# Patient Record
Sex: Male | Born: 1980 | Hispanic: Yes | Marital: Single | Smoking: Current every day smoker
Health system: Southern US, Community
[De-identification: ages and names within clinical notes are randomized; demographics above are authoritative.]

## PROBLEM LIST (undated history)

## (undated) HISTORY — PX: NO PAST SURGERIES: SHX2092

---

## 2004-12-22 ENCOUNTER — Emergency Department (HOSPITAL_COMMUNITY): Admission: EM | Admit: 2004-12-22 | Discharge: 2004-12-22 | Payer: Self-pay | Admitting: Emergency Medicine

## 2013-01-19 ENCOUNTER — Emergency Department (HOSPITAL_COMMUNITY)
Admission: EM | Admit: 2013-01-19 | Discharge: 2013-01-19 | Disposition: A | Discharge status: Home / Self Care | Payer: Self-pay | Attending: Emergency Medicine | Admitting: Emergency Medicine

## 2013-01-19 ENCOUNTER — Encounter (HOSPITAL_COMMUNITY): Payer: Self-pay | Admitting: Emergency Medicine

## 2013-01-19 ENCOUNTER — Emergency Department (HOSPITAL_COMMUNITY): Payer: Self-pay

## 2013-01-19 DIAGNOSIS — S20219A Contusion of unspecified front wall of thorax, initial encounter: Secondary | ICD-10-CM | POA: Insufficient documentation

## 2013-01-19 DIAGNOSIS — S20212A Contusion of left front wall of thorax, initial encounter: Secondary | ICD-10-CM

## 2013-01-19 DIAGNOSIS — H53149 Visual discomfort, unspecified: Secondary | ICD-10-CM | POA: Insufficient documentation

## 2013-01-19 DIAGNOSIS — S52202A Unspecified fracture of shaft of left ulna, initial encounter for closed fracture: Secondary | ICD-10-CM

## 2013-01-19 DIAGNOSIS — S0003XA Contusion of scalp, initial encounter: Secondary | ICD-10-CM | POA: Insufficient documentation

## 2013-01-19 DIAGNOSIS — S52209A Unspecified fracture of shaft of unspecified ulna, initial encounter for closed fracture: Secondary | ICD-10-CM | POA: Insufficient documentation

## 2013-01-19 DIAGNOSIS — S8010XA Contusion of unspecified lower leg, initial encounter: Secondary | ICD-10-CM | POA: Insufficient documentation

## 2013-01-19 DIAGNOSIS — S0990XA Unspecified injury of head, initial encounter: Secondary | ICD-10-CM | POA: Insufficient documentation

## 2013-01-19 MED ORDER — LORAZEPAM 1 MG PO TABS: 1.0000 mg | ORAL_TABLET | Freq: Once | ORAL | Status: DC

## 2013-01-19 MED ORDER — MORPHINE SULFATE 4 MG/ML IJ SOLN
4.0000 mg | Freq: Once | INTRAMUSCULAR | Status: AC
  Administered 2013-01-19: 4 mg via INTRAVENOUS
  Filled 2013-01-19: qty 1

## 2013-01-19 MED ORDER — SODIUM CHLORIDE 0.9 % IV BOLUS (SEPSIS)
1000.0000 mL | Freq: Once | INTRAVENOUS | Status: AC
  Administered 2013-01-19: 1000 mL via INTRAVENOUS

## 2013-01-19 MED ORDER — HYDROCODONE-ACETAMINOPHEN 5-325 MG PO TABS: 2.0000 | ORAL_TABLET | ORAL | Status: DC | PRN

## 2013-01-19 MED ORDER — ONDANSETRON HCL 4 MG/2ML IJ SOLN
4.0000 mg | Freq: Once | INTRAMUSCULAR | Status: AC
  Administered 2013-01-19: 4 mg via INTRAVENOUS
  Filled 2013-01-19: qty 2
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
## 2013-01-19 NOTE — Progress Notes (Signed)
Orthopedic Tech Progress Note Patient Details:  [name] 12-15-1980 [id number]  Ortho Devices Type of Ortho Device: Ace wrap;Arm sling;Long arm splint Ortho Device/Splint Location: lue Ortho Device/Splint Interventions: Application   Tara Rud 01/19/2013, 1:41 PM
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
## 2013-01-19 NOTE — Discharge Instructions (Signed)
Take Vicodin as needed for pain. Maintain the splint until your Orthopedic follow up appointment. Follow up with Dr. Melvyn Novas in the office. Call to make an appointment today.   Tome Vicodin , segn sea necesario para Chief Technology Officer . Mantener la frula hasta que su Ortopdica cita de seguimiento . Haga un seguimiento con el Dr. Melvyn Novas en la oficina. Llame para hacer una cita hoy mismo .

## 2013-01-19 NOTE — ED Provider Notes (Signed)
Medical screening examination/treatment/procedure(s) were performed by non-physician practitioner and as supervising physician I was immediately available for consultation/collaboration.  EKG Interpretation   None         Audree Camel, MD 01/19/13 1658

## 2013-01-19 NOTE — ED Notes (Signed)
Ortho at bedside.

## 2013-01-27 ENCOUNTER — Other Ambulatory Visit (HOSPITAL_COMMUNITY): Payer: Self-pay | Admitting: *Deleted

## 2013-01-27 ENCOUNTER — Encounter (HOSPITAL_COMMUNITY): Payer: Self-pay | Admitting: *Deleted

## 2013-01-27 MED ORDER — CEFAZOLIN SODIUM-DEXTROSE 2-3 GM-% IV SOLR: 2.0000 g | INTRAVENOUS | Status: DC

## 2013-01-27 NOTE — Progress Notes (Signed)
Pre op phone call completed using Telephonic Interpreting- [phone].

## 2013-01-28 ENCOUNTER — Ambulatory Visit (HOSPITAL_COMMUNITY): Payer: No Typology Code available for payment source | Admitting: Anesthesiology

## 2013-01-28 ENCOUNTER — Encounter (HOSPITAL_COMMUNITY): Payer: Self-pay

## 2013-01-28 ENCOUNTER — Encounter (HOSPITAL_COMMUNITY): Payer: No Typology Code available for payment source | Admitting: Anesthesiology

## 2013-01-28 ENCOUNTER — Encounter (HOSPITAL_COMMUNITY)
Admission: RE | Disposition: A | Discharge status: Home / Self Care | Payer: Self-pay | Source: Ambulatory Visit | Attending: Orthopedic Surgery

## 2013-01-28 ENCOUNTER — Ambulatory Visit (HOSPITAL_COMMUNITY)
Admission: RE | Admit: 2013-01-28 | Discharge: 2013-01-28 | Disposition: A | Discharge status: Home / Self Care | Payer: No Typology Code available for payment source | Source: Ambulatory Visit | Attending: Orthopedic Surgery | Admitting: Orthopedic Surgery

## 2013-01-28 DIAGNOSIS — S52209A Unspecified fracture of shaft of unspecified ulna, initial encounter for closed fracture: Secondary | ICD-10-CM | POA: Insufficient documentation

## 2013-01-28 HISTORY — PX: ORIF ULNAR FRACTURE: SHX5417

## 2013-01-28 LAB — POCT I-STAT 4, (NA,K, GLUC, HGB,HCT)
Glucose, Bld: 96 mg/dL (ref 70–99)
Potassium: 3.8 mEq/L (ref 3.5–5.1)

## 2013-01-28 SURGERY — OPEN REDUCTION INTERNAL FIXATION (ORIF) ULNAR FRACTURE
Anesthesia: General | Site: Elbow | Laterality: Left | Wound class: Dirty or Infected

## 2013-01-28 MED ORDER — DEXAMETHASONE SODIUM PHOSPHATE 4 MG/ML IJ SOLN
INTRAMUSCULAR | Status: DC | PRN
  Administered 2013-01-28: 4 mg via INTRAVENOUS

## 2013-01-28 MED ORDER — CEFAZOLIN SODIUM-DEXTROSE 2-3 GM-% IV SOLR
INTRAVENOUS | Status: DC | PRN
  Administered 2013-01-28: 2 g via INTRAVENOUS

## 2013-01-28 MED ORDER — 0.9 % SODIUM CHLORIDE (POUR BTL) OPTIME
TOPICAL | Status: DC | PRN
  Administered 2013-01-28: 1000 mL

## 2013-01-28 MED ORDER — ONDANSETRON HCL 4 MG/2ML IJ SOLN: 4.0000 mg | Freq: Once | INTRAMUSCULAR | Status: DC | PRN

## 2013-01-28 MED ORDER — HYDROMORPHONE HCL PF 1 MG/ML IJ SOLN
0.2500 mg | INTRAMUSCULAR | Status: DC | PRN
  Administered 2013-01-28: 0.5 mg via INTRAVENOUS

## 2013-01-28 MED ORDER — PROPOFOL 10 MG/ML IV BOLUS
INTRAVENOUS | Status: DC | PRN
  Administered 2013-01-28: 200 mg via INTRAVENOUS

## 2013-01-28 MED ORDER — BUPIVACAINE-EPINEPHRINE PF 0.5-1:200000 % IJ SOLN
INTRAMUSCULAR | Status: DC | PRN
  Administered 2013-01-28: 25 mL

## 2013-01-28 MED ORDER — LACTATED RINGERS IV SOLN
INTRAVENOUS | Status: DC
  Administered 2013-01-28 (×2): via INTRAVENOUS

## 2013-01-28 MED ORDER — DEXTROSE 5 % IV SOLN
INTRAVENOUS | Status: DC | PRN
  Administered 2013-01-28: 20:00:00 via INTRAVENOUS

## 2013-01-28 MED ORDER — CHLORHEXIDINE GLUCONATE 4 % EX LIQD: 60.0000 mL | Freq: Once | CUTANEOUS | Status: DC

## 2013-01-28 MED ORDER — DOCUSATE SODIUM 100 MG PO CAPS: 100.0000 mg | ORAL_CAPSULE | Freq: Two times a day (BID) | ORAL | Status: DC

## 2013-01-28 MED ORDER — CEFAZOLIN SODIUM-DEXTROSE 2-3 GM-% IV SOLR
INTRAVENOUS | Status: AC
  Filled 2013-01-28: qty 50

## 2013-01-28 MED ORDER — SUFENTANIL CITRATE 50 MCG/ML IV SOLN
INTRAVENOUS | Status: DC | PRN
  Administered 2013-01-28: 10 ug via INTRAVENOUS

## 2013-01-28 MED ORDER — HYDROMORPHONE HCL PF 1 MG/ML IJ SOLN
INTRAMUSCULAR | Status: AC
  Filled 2013-01-28: qty 1

## 2013-01-28 MED ORDER — MIDAZOLAM HCL 2 MG/2ML IJ SOLN
INTRAMUSCULAR | Status: DC | PRN
  Administered 2013-01-28 (×2): 2 mg via INTRAVENOUS

## 2013-01-28 MED ORDER — ONDANSETRON HCL 4 MG/2ML IJ SOLN
INTRAMUSCULAR | Status: DC | PRN
  Administered 2013-01-28: 4 mg via INTRAVENOUS

## 2013-01-28 MED ORDER — OXYCODONE-ACETAMINOPHEN 10-325 MG PO TABS: 1.0000 | ORAL_TABLET | ORAL | Status: DC | PRN

## 2013-01-28 MED ORDER — LIDOCAINE HCL (CARDIAC) 20 MG/ML IV SOLN
INTRAVENOUS | Status: DC | PRN
  Administered 2013-01-28: 60 mg via INTRAVENOUS

## 2013-01-28 MED ORDER — CEPHALEXIN 500 MG PO CAPS: 500.0000 mg | ORAL_CAPSULE | Freq: Four times a day (QID) | ORAL | Status: DC

## 2013-01-28 MED ORDER — FENTANYL CITRATE 0.05 MG/ML IJ SOLN
INTRAMUSCULAR | Status: DC | PRN
  Administered 2013-01-28: 50 ug via INTRAVENOUS

## 2013-01-28 SURGICAL SUPPLY — 59 items
BANDAGE ELASTIC 3 VELCRO ST LF (GAUZE/BANDAGES/DRESSINGS) ×2 IMPLANT
BANDAGE ELASTIC 4 VELCRO ST LF (GAUZE/BANDAGES/DRESSINGS) ×2 IMPLANT
BANDAGE GAUZE ELAST BULKY 4 IN (GAUZE/BANDAGES/DRESSINGS) ×2 IMPLANT
BIT DRILL 2.5X2.75 QC CALB (BIT) ×1 IMPLANT
BLADE SURG ROTATE 9660 (MISCELLANEOUS) IMPLANT
BNDG CMPR 9X4 STRL LF SNTH (GAUZE/BANDAGES/DRESSINGS) ×1
BNDG ESMARK 4X9 LF (GAUZE/BANDAGES/DRESSINGS) ×2 IMPLANT
CLOTH BEACON ORANGE TIMEOUT ST (SAFETY) ×2 IMPLANT
CORDS BIPOLAR (ELECTRODE) ×2 IMPLANT
COVER SURGICAL LIGHT HANDLE (MISCELLANEOUS) ×2 IMPLANT
CUFF TOURNIQUET SINGLE 18IN (TOURNIQUET CUFF) ×2 IMPLANT
CUFF TOURNIQUET SINGLE 24IN (TOURNIQUET CUFF) IMPLANT
DRAIN TLS ROUND 10FR (DRAIN) IMPLANT
DRAPE OEC MINIVIEW 54X84 (DRAPES) ×2 IMPLANT
DRAPE SURG 17X11 SM STRL (DRAPES) ×2 IMPLANT
DRSG ADAPTIC 3X8 NADH LF (GAUZE/BANDAGES/DRESSINGS) ×1 IMPLANT
ELECT REM PT RETURN 9FT ADLT (ELECTROSURGICAL)
ELECTRODE REM PT RTRN 9FT ADLT (ELECTROSURGICAL) IMPLANT
GAUZE SPONGE 4X4 16PLY XRAY LF (GAUZE/BANDAGES/DRESSINGS) ×1 IMPLANT
GAUZE XEROFORM 5X9 LF (GAUZE/BANDAGES/DRESSINGS) ×1 IMPLANT
GLOVE BIOGEL PI IND STRL 8.5 (GLOVE) ×1 IMPLANT
GLOVE BIOGEL PI INDICATOR 8.5 (GLOVE) ×1
GLOVE SURG ORTHO 8.0 STRL STRW (GLOVE) ×2 IMPLANT
GOWN PREVENTION PLUS XLARGE (GOWN DISPOSABLE) ×2 IMPLANT
GOWN STRL NON-REIN LRG LVL3 (GOWN DISPOSABLE) ×5 IMPLANT
KIT BASIN OR (CUSTOM PROCEDURE TRAY) ×2 IMPLANT
KIT ROOM TURNOVER OR (KITS) ×2 IMPLANT
MANIFOLD NEPTUNE II (INSTRUMENTS) ×1 IMPLANT
NDL HYPO 25X1 1.5 SAFETY (NEEDLE) ×1 IMPLANT
NEEDLE HYPO 25X1 1.5 SAFETY (NEEDLE) ×2 IMPLANT
NS IRRIG 1000ML POUR BTL (IV SOLUTION) ×2 IMPLANT
PACK ORTHO EXTREMITY (CUSTOM PROCEDURE TRAY) ×2 IMPLANT
PAD ARMBOARD 7.5X6 YLW CONV (MISCELLANEOUS) ×4 IMPLANT
PAD CAST 3X4 CTTN HI CHSV (CAST SUPPLIES) ×2 IMPLANT
PAD CAST 4YDX4 CTTN HI CHSV (CAST SUPPLIES) ×1 IMPLANT
PADDING CAST COTTON 3X4 STRL (CAST SUPPLIES) ×4
PADDING CAST COTTON 4X4 STRL (CAST SUPPLIES) ×2
PLATE LOCK COMP 6H FOOT (Plate) ×1 IMPLANT
SCREW CORTICAL 3.5MM  16MM (Screw) ×5 IMPLANT
SCREW CORTICAL 3.5MM 14MM (Screw) ×1 IMPLANT
SCREW CORTICAL 3.5MM 16MM (Screw) IMPLANT
SOAP 2 % CHG 4 OZ (WOUND CARE) ×2 IMPLANT
SPLINT FIBERGLASS 4X30 (CAST SUPPLIES) ×1 IMPLANT
SPONGE GAUZE 4X4 12PLY (GAUZE/BANDAGES/DRESSINGS) ×2 IMPLANT
SPONGE LAP 4X18 X RAY DECT (DISPOSABLE) ×1 IMPLANT
STRIP CLOSURE SKIN 1/2X4 (GAUZE/BANDAGES/DRESSINGS) IMPLANT
SUCTION FRAZIER TIP 10 FR DISP (SUCTIONS) ×2 IMPLANT
SUT ETHILON 4 0 PS 2 18 (SUTURE) ×2 IMPLANT
SUT MNCRL AB 4-0 PS2 18 (SUTURE) IMPLANT
SUT PROLENE 4 0 PS 2 18 (SUTURE) ×1 IMPLANT
SUT VIC AB 2-0 FS1 27 (SUTURE) ×3 IMPLANT
SUT VICRYL 4-0 PS2 18IN ABS (SUTURE) ×2 IMPLANT
SYR CONTROL 10ML LL (SYRINGE) ×1 IMPLANT
SYSTEM CHEST DRAIN TLS 7FR (DRAIN) IMPLANT
TOWEL OR 17X24 6PK STRL BLUE (TOWEL DISPOSABLE) ×2 IMPLANT
TOWEL OR 17X26 10 PK STRL BLUE (TOWEL DISPOSABLE) ×2 IMPLANT
TUBE CONNECTING 12X1/4 (SUCTIONS) ×2 IMPLANT
WATER STERILE IRR 1000ML POUR (IV SOLUTION) ×1 IMPLANT
YANKAUER SUCT BULB TIP NO VENT (SUCTIONS) ×1 IMPLANT

## 2013-01-28 NOTE — Transfer of Care (Signed)
Immediate Anesthesia Transfer of Care Note  Patient: [name]  Procedure(s) Performed: Procedure(s):  LEFT ULNAR OPEN REDUCTION INTERNAL FIXATION (ORIF)  (Left)  Patient Location: PACU  Anesthesia Type:General  Level of Consciousness: awake, alert  and oriented  Airway & Oxygen Therapy: Patient Spontanous Breathing and Patient connected to nasal cannula oxygen  Post-op Assessment: Report given to PACU RN and Post -op Vital signs reviewed and stable  Post vital signs: Reviewed and stable  Complications: No apparent anesthesia complications
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
## 2013-01-28 NOTE — Progress Notes (Signed)
Interpretor remains with pt., Pt. Aware that the surgery schedule presents with start time at approx. 7pm or after.
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
## 2013-01-28 NOTE — Anesthesia Postprocedure Evaluation (Signed)
  Anesthesia Post-op Note  Patient: [name]  Procedure(s) Performed: Procedure(s):  LEFT ULNAR OPEN REDUCTION INTERNAL FIXATION (ORIF)  (Left)  Patient Location: PACU  Anesthesia Type:GA combined with regional for post-op pain  Level of Consciousness: awake, alert  and oriented  Airway and Oxygen Therapy: Patient Spontanous Breathing and Patient connected to nasal cannula oxygen  Post-op Pain: none  Post-op Assessment: Post-op Vital signs reviewed  Post-op Vital Signs: Reviewed  Complications: No apparent anesthesia complications

## 2013-01-28 NOTE — Progress Notes (Signed)
Interpreter Mihika Surrette Namihira for Pre-surgery 

## 2013-01-28 NOTE — Anesthesia Procedure Notes (Addendum)
Anesthesia Regional Block:  Supraclavicular block  Pre-Anesthetic Checklist: ,, timeout performed, Correct Patient, Correct Site, Correct Laterality, Correct Procedure, Correct Position, site marked, Risks and benefits discussed,  Surgical consent,  Pre-op evaluation,  At surgeon's request and post-op pain management  Laterality: Left and Upper  Prep: chloraprep       Needles:  Injection technique: Single-shot  Needle Type: Echogenic Stimulator Needle     Needle Length: 5cm 5 cm Needle Gauge: 21 and 21 G    Additional Needles:  Procedures: ultrasound guided (picture in chart) Supraclavicular block Narrative:  Start time: 01/28/2013 6:58 PM End time: 01/28/2013 7:06 PM Injection made incrementally with aspirations every 5 mL.  Performed by: Personally  Anesthesiologist: Sheldon Silvan  Supraclavicular block

## 2013-01-28 NOTE — Anesthesia Preprocedure Evaluation (Signed)
Anesthesia Evaluation  Patient identified by MRN, date of birth, ID band Patient awake    Reviewed: Allergy & Precautions, H&P , NPO status , Patient's Chart, lab work & pertinent test results  Airway        Dental   Pulmonary          Cardiovascular     Neuro/Psych    GI/Hepatic   Endo/Other    Renal/GU      Musculoskeletal   Abdominal   Peds  Hematology   Anesthesia Other Findings   Reproductive/Obstetrics                             Anesthesia Physical Anesthesia Plan  ASA: I  Anesthesia Plan: General   Post-op Pain Management:    Induction: Intravenous  Airway Management Planned: LMA  Additional Equipment:   Intra-op Plan:   Post-operative Plan: Extubation in OR  Informed Consent: I have reviewed the patients History and Physical, chart, labs and discussed the procedure including the risks, benefits and alternatives for the proposed anesthesia with the patient or authorized representative who has indicated his/her understanding and acceptance.     Plan Discussed with: CRNA, Anesthesiologist and Surgeon  Anesthesia Plan Comments:         Anesthesia Quick Evaluation  

## 2013-01-28 NOTE — Brief Op Note (Signed)
01/28/2013  4:52 PM  PATIENT:  [name]  32 y.o. male  PRE-OPERATIVE DIAGNOSIS:  LEFT ULNAR SHAFT FRACTURE   POST-OPERATIVE DIAGNOSIS: SAME  PROCEDURE:  Procedure(s):  LEFT ULNAR OPEN REDUCTION INTERNAL FIXATION (ORIF)  (Left)  SURGEON:  Surgeon(s) and Role:    * Sharma Covert, MD - Primary  PHYSICIAN ASSISTANT:   ASSISTANTS: none   ANESTHESIA:   general  EBL:     BLOOD ADMINISTERED:none  DRAINS: none   LOCAL MEDICATIONS USED:  MARCAINE     SPECIMEN:  No Specimen  DISPOSITION OF SPECIMEN:  N/A  COUNTS:  YES  TOURNIQUET:    DICTATION: .[id number]  PLAN OF CARE: Discharge to home after PACU  PATIENT DISPOSITION:  PACU - hemodynamically stable.   Delay start of Pharmacological VTE agent (>24hrs) due to surgical blood loss or risk of bleeding: not applicable
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
## 2013-02-02 ENCOUNTER — Encounter (HOSPITAL_COMMUNITY): Payer: Self-pay | Admitting: Orthopedic Surgery

## 2015-09-27 ENCOUNTER — Encounter (HOSPITAL_COMMUNITY): Payer: Self-pay | Admitting: Emergency Medicine

## 2015-09-27 DIAGNOSIS — K5792 Diverticulitis of intestine, part unspecified, without perforation or abscess without bleeding: Secondary | ICD-10-CM | POA: Insufficient documentation

## 2015-09-27 DIAGNOSIS — Z79899 Other long term (current) drug therapy: Secondary | ICD-10-CM | POA: Insufficient documentation

## 2015-09-27 LAB — URINALYSIS, ROUTINE W REFLEX MICROSCOPIC
Bilirubin Urine: NEGATIVE
Glucose, UA: NEGATIVE mg/dL
KETONES UR: NEGATIVE mg/dL
LEUKOCYTES UA: NEGATIVE
NITRITE: NEGATIVE
PROTEIN: NEGATIVE mg/dL
Specific Gravity, Urine: 1.023 (ref 1.005–1.030)
pH: 6 (ref 5.0–8.0)

## 2015-09-27 LAB — CBC
HCT: 34.3 % — ABNORMAL LOW (ref 39.0–52.0)
HEMOGLOBIN: 11.7 g/dL — AB (ref 13.0–17.0)
MCH: 29.5 pg (ref 26.0–34.0)
MCHC: 34.1 g/dL (ref 30.0–36.0)
MCV: 86.6 fL (ref 78.0–100.0)
PLATELETS: 378 10*3/uL (ref 150–400)
RBC: 3.96 MIL/uL — AB (ref 4.22–5.81)
RDW: 12.2 % (ref 11.5–15.5)
WBC: 7.9 10*3/uL (ref 4.0–10.5)

## 2015-09-27 LAB — COMPREHENSIVE METABOLIC PANEL
ALT: 73 U/L — ABNORMAL HIGH (ref 17–63)
ANION GAP: 9 (ref 5–15)
AST: 42 U/L — ABNORMAL HIGH (ref 15–41)
Albumin: 3.8 g/dL (ref 3.5–5.0)
Alkaline Phosphatase: 93 U/L (ref 38–126)
BUN: 7 mg/dL (ref 6–20)
CHLORIDE: 105 mmol/L (ref 101–111)
CO2: 23 mmol/L (ref 22–32)
Calcium: 9.3 mg/dL (ref 8.9–10.3)
Creatinine, Ser: 0.67 mg/dL (ref 0.61–1.24)
GFR calc non Af Amer: >60 mL/min (ref >60)
Glucose, Bld: 131 mg/dL — ABNORMAL HIGH (ref 65–99)
Potassium: 3.8 mmol/L (ref 3.5–5.1)
SODIUM: 137 mmol/L (ref 135–145)
Total Bilirubin: 0.6 mg/dL (ref 0.3–1.2)
Total Protein: 7.8 g/dL (ref 6.5–8.1)

## 2015-09-27 LAB — LIPASE, BLOOD: LIPASE: 26 U/L (ref 11–51)

## 2015-09-27 LAB — URINE MICROSCOPIC-ADD ON

## 2015-09-27 NOTE — ED Notes (Signed)
Pt states for the last 3 days he has been having mis abd pain and unable to have BM for 3 days. Pt also reports vomiting once last night. Pt also c/o of left ear pain.

## 2015-09-28 ENCOUNTER — Emergency Department (HOSPITAL_COMMUNITY)
Admission: EM | Admit: 2015-09-28 | Discharge: 2015-09-28 | Disposition: A | Discharge status: Home / Self Care | Payer: Self-pay | Attending: Emergency Medicine | Admitting: Emergency Medicine

## 2015-09-28 ENCOUNTER — Encounter (HOSPITAL_COMMUNITY): Payer: Self-pay | Admitting: Radiology

## 2015-09-28 ENCOUNTER — Emergency Department (HOSPITAL_COMMUNITY): Payer: Self-pay

## 2015-09-28 DIAGNOSIS — K5792 Diverticulitis of intestine, part unspecified, without perforation or abscess without bleeding: Secondary | ICD-10-CM

## 2015-09-28 MED ORDER — OXYCODONE-ACETAMINOPHEN 5-325 MG PO TABS
1.0000 | ORAL_TABLET | Freq: Once | ORAL | Status: AC
  Administered 2015-09-28: 1 via ORAL
  Filled 2015-09-28: qty 1

## 2015-09-28 MED ORDER — CIPROFLOXACIN HCL 500 MG PO TABS: 500.0000 mg | ORAL_TABLET | Freq: Two times a day (BID) | ORAL | Status: DC

## 2015-09-28 MED ORDER — ONDANSETRON 4 MG PO TBDP
4.0000 mg | ORAL_TABLET | Freq: Once | ORAL | Status: AC
  Administered 2015-09-28: 4 mg via ORAL
  Filled 2015-09-28: qty 1

## 2015-09-28 MED ORDER — HYDROCODONE-ACETAMINOPHEN 5-325 MG PO TABS: 1.0000 | ORAL_TABLET | ORAL | Status: DC | PRN

## 2015-09-28 MED ORDER — METRONIDAZOLE 500 MG PO TABS
500.0000 mg | ORAL_TABLET | Freq: Once | ORAL | Status: AC
  Administered 2015-09-28: 500 mg via ORAL
  Filled 2015-09-28: qty 1

## 2015-09-28 MED ORDER — CIPROFLOXACIN HCL 500 MG PO TABS
500.0000 mg | ORAL_TABLET | Freq: Once | ORAL | Status: AC
  Administered 2015-09-28: 500 mg via ORAL
  Filled 2015-09-28: qty 1

## 2015-09-28 MED ORDER — ONDANSETRON HCL 4 MG PO TABS: 4.0000 mg | ORAL_TABLET | Freq: Four times a day (QID) | ORAL | Status: DC

## 2015-09-28 MED ORDER — OXYMETAZOLINE HCL 0.05 % NA SOLN
1.0000 | Freq: Once | NASAL | Status: AC
  Administered 2015-09-28: 1 via NASAL
  Filled 2015-09-28: qty 15

## 2015-09-28 MED ORDER — METRONIDAZOLE 500 MG PO TABS: 500.0000 mg | ORAL_TABLET | Freq: Two times a day (BID) | ORAL | Status: DC

## 2015-09-28 NOTE — ED Provider Notes (Signed)
CSN: [id number]     Arrival date & time 09/27/15  2017 History   First MD Initiated Contact with Patient 09/28/15 0038     Chief Complaint  Patient presents with  . Abdominal Pain     (Consider location/radiation/quality/duration/timing/severity/associated sxs/prior Treatment) HPI  Spanish Speaking Interview done with a phone interpretor  Patient has had constipation and pain to the left lower abdomen for the past 3 days. He has tried taking an oral laxative but he did not have any bowel movement. He denies to me having any vomiting but has felt nauseous. He hs not tried to take any pain medications. He reports feeling feverish but has not taken his temperature. He denies any PMH of abdominal surgery or problems. He has been passing normal amount of flatus.  ROS: The patient denies confusion, diaphoresis, V/D, dysuria, abnormal  bleeding, genital discharge, fever, headache, weakness (general or focal), confusion, change of vision,  dysphagia, aphagia, shortness of breath, lower extremity swelling, rash, neck pain, chest pain, shortness of breath,  back pain.   History reviewed. No pertinent past medical history. Past Surgical History  Procedure Laterality Date  . No past surgeries    . Orif ulnar fracture Left 01/28/2013    Procedure:  LEFT ULNAR OPEN REDUCTION INTERNAL FIXATION (ORIF) ;  Surgeon: Sharma Covert, MD;  Location: [facility] OR;  Service: Orthopedics;  Laterality: Left;   No family history on file. Social History  Substance Use Topics  . Smoking status: Never Smoker   . Smokeless tobacco: None  . Alcohol Use: Yes     Comment: rare    Review of Systems  Review of Systems All other systems negative except as documented in the HPI. All pertinent positives and negatives as reviewed in the HPI.   Allergies  Review of patient's allergies indicates no known allergies.  Home Medications   Prior to Admission medications   Medication Sig Start Date End Date Taking?  Authorizing Provider  ciprofloxacin (CIPRO) 500 MG tablet Take 1 tablet (500 mg total) by mouth 2 (two) times daily. 09/28/15   Marlon Pel, PA-C  HYDROcodone-acetaminophen (NORCO/VICODIN) 5-325 MG tablet Take 1-2 tablets by mouth every 4 (four) hours as needed. 09/28/15   Hercules Hasler Neva Seat, PA-C  metroNIDAZOLE (FLAGYL) 500 MG tablet Take 1 tablet (500 mg total) by mouth 2 (two) times daily. 09/28/15   Malayjah Otoole Neva Seat, PA-C  ondansetron (ZOFRAN) 4 MG tablet Take 1 tablet (4 mg total) by mouth every 6 (six) hours. 09/28/15   Samrat Hayward Neva Seat, PA-C   BP 112/72 mmHg  Pulse 87  Temp(Src) 98.4 F (36.9 C) (Oral)  Resp 17  Ht 5\' 3"  (1.6 m)  Wt 88.451 kg  BMI 34.55 kg/m2  SpO2 99% Physical Exam  Constitutional: He appears well-developed and well-nourished. No distress.  HENT:  Head: Normocephalic and atraumatic.  Right Ear: Tympanic membrane and ear canal normal.  Left Ear: Tympanic membrane and ear canal normal.  Nose: Nose normal.  Mouth/Throat: Uvula is midline, oropharynx is clear and moist and mucous membranes are normal.  Eyes: Pupils are equal, round, and reactive to light.  Neck: Normal range of motion. Neck supple.  Cardiovascular: Normal rate and regular rhythm.   Pulmonary/Chest: Effort normal.  Abdominal: Soft. Bowel sounds are normal. He exhibits no distension. There is no hepatosplenomegaly. There is tenderness. There is CVA tenderness. There is no rigidity, no rebound and no guarding.  No signs of abdominal distention  Musculoskeletal:  No LE swelling  Neurological: He is alert.  Acting at baseline  Skin: Skin is warm and dry. No rash noted.  Nursing note and vitals reviewed.   ED Course  Procedures (including critical care time) Labs Review Labs Reviewed  COMPREHENSIVE METABOLIC PANEL - Abnormal; Notable for the following:    Glucose, Bld 131 (*)    AST 42 (*)    ALT 73 (*)    All other components within normal limits  CBC - Abnormal; Notable for the following:    RBC  3.96 (*)    Hemoglobin 11.7 (*)    HCT 34.3 (*)    All other components within normal limits  URINALYSIS, ROUTINE W REFLEX MICROSCOPIC (NOT AT [facility]) - Abnormal; Notable for the following:    Hgb urine dipstick MODERATE (*)    All other components within normal limits  URINE MICROSCOPIC-ADD ON - Abnormal; Notable for the following:    Squamous Epithelial / LPF 0-5 (*)    Bacteria, UA RARE (*)    All other components within normal limits  LIPASE, BLOOD    Imaging Review Ct Abdomen Pelvis Wo Contrast  09/28/2015  CLINICAL DATA:  Mid abdominal pain and constipation for 3 days. Vomiting. LEFT lower quadrant pain and hematuria. EXAM: CT ABDOMEN AND PELVIS WITHOUT CONTRAST TECHNIQUE: Multidetector CT imaging of the abdomen and pelvis was performed following the standard protocol without IV contrast. COMPARISON:  None. FINDINGS: LUNG BASES: Included view of the lung bases are clear. The visualized heart and pericardium are unremarkable. KIDNEYS/BLADDER: Kidneys are orthotopic, demonstrating normal size and morphology. No nephrolithiasis, hydronephrosis; limited assessment for renal masses on this nonenhanced examination. The unopacified ureters are normal in course and caliber. Urinary bladder is well distended and unremarkable. SOLID ORGANS: The liver, spleen, gallbladder, pancreas and adrenal glands are unremarkable for this non-contrast examination. GASTROINTESTINAL TRACT: Very small hiatal hernia. Mild colonic diverticulosis with by cm segment of severe sigmoid wall thickening, pericolonic inflammation. The stomach, small bowel are normal in course and caliber without inflammatory changes, the sensitivity may be decreased by lack of enteric contrast. Normal appendix. PERITONEUM/RETROPERITONEUM: Aortoiliac vessels are normal in course and caliber. No lymphadenopathy by CT size criteria. Prostate size is normal. Trace free fluid in the pericolic gutter is likely reactive. No abscess. No intraperitoneal  free air. SOFT TISSUES/ OSSEOUS STRUCTURES: Nonsuspicious. Mild bilateral hip osteoarthrosis. IMPRESSION: Severe sigmoid diverticulitis without complication. No urolithiasis or obstructive uropathy. Electronically Signed   By: Awilda Metro M.D.   On: 09/28/2015 02:58   I have personally reviewed and evaluated these images and lab results as part of my medical decision-making.   EKG Interpretation None      MDM   Final diagnoses:  Diverticulitis of intestine without perforation or abscess without bleeding    Patient has severe sigmoid diverticulitis on CT scan without perforation or abscess. His pain clinically is not severe. His been afebrile, nontoxic-appearing no vomiting or diarrhea. The patient is a good candidate for outpatient treatment. He is a phone interpreter I discussed the diagnosis and the plan. The plan is for her to antibiotics for 2 weeks, pain medicine and nausea medicine. Social work has seen him and has arranged follow-up with community clinic. I discussed return precautions.  Patient did have hematuria on his urinalysis otherwise his CBC, CMP, lipase are otherwise unremarkable.  Blood pressure 112/72, pulse 87, temperature 98.4 F (36.9 C), temperature source Oral, resp. rate 17, height  (1.6 m), weight 88.451 kg, SpO2 99 %.    Marlon Pel, PA-C 09/28/15 [phone]  Dione Boozeavid Glick, MD 09/28/15 [phone]

## 2015-09-28 NOTE — Discharge Instructions (Signed)
Diverticulitis °Diverticulitis is inflammation or infection of small pouches in your colon that form when you have a condition called diverticulosis. The pouches in your colon are called diverticula. Your colon, or large intestine, is where water is absorbed and stool is formed. °Complications of diverticulitis can include: °· Bleeding. °· Severe infection. °· Severe pain. °· Perforation of your colon. °· Obstruction of your colon. °CAUSES  °Diverticulitis is caused by bacteria. °Diverticulitis happens when stool becomes trapped in diverticula. This allows bacteria to grow in the diverticula, which can lead to inflammation and infection. °RISK FACTORS °People with diverticulosis are at risk for diverticulitis. Eating a diet that does not include enough fiber from fruits and vegetables may make diverticulitis more likely to develop. °SYMPTOMS  °Symptoms of diverticulitis may include: °· Abdominal pain and tenderness. The pain is normally located on the left side of the abdomen, but may occur in other areas. °· Fever and chills. °· Bloating. °· Cramping. °· Nausea. °· Vomiting. °· Constipation. °· Diarrhea. °· Blood in your stool. °DIAGNOSIS  °Your health care provider will ask you about your medical history and do a physical exam. You may need to have tests done because many medical conditions can cause the same symptoms as diverticulitis. Tests may include: °· Blood tests. °· Urine tests. °· Imaging tests of the abdomen, including X-rays and CT scans. °When your condition is under control, your health care provider may recommend that you have a colonoscopy. A colonoscopy can show how severe your diverticula are and whether something else is causing your symptoms. °TREATMENT  °Most cases of diverticulitis are mild and can be treated at home. Treatment may include: °· Taking over-the-counter pain medicines. °· Following a clear liquid diet. °· Taking antibiotic medicines by mouth for 7-10 days. °More severe cases may  be treated at a hospital. Treatment may include: °· Not eating or drinking. °· Taking prescription pain medicine. °· Receiving antibiotic medicines through an IV tube. °· Receiving fluids and nutrition through an IV tube. °· Surgery. °HOME CARE INSTRUCTIONS  °· Follow your health care provider's instructions carefully. °· Follow a full liquid diet or other diet as directed by your health care provider. After your symptoms improve, your health care provider may tell you to change your diet. He or she may recommend you eat a high-fiber diet. Fruits and vegetables are good sources of fiber. Fiber makes it easier to pass stool. °· Take fiber supplements or probiotics as directed by your health care provider. °· Only take medicines as directed by your health care provider. °· Keep all your follow-up appointments. °SEEK MEDICAL CARE IF:  °· Your pain does not improve. °· You have a hard time eating food. °· Your bowel movements do not return to normal. °SEEK IMMEDIATE MEDICAL CARE IF:  °· Your pain becomes worse. °· Your symptoms do not get better. °· Your symptoms suddenly get worse. °· You have a fever. °· You have repeated vomiting. °· You have bloody or black, tarry stools. °MAKE SURE YOU:  °· Understand these instructions. °· Will watch your condition. °· Will get help right away if you are not doing well or get worse. °  °This information is not intended to replace advice given to you by your health care provider. Make sure you discuss any questions you have with your health care provider. °  °Document Released: 12/13/2004 Document Revised: 03/10/2013 Document Reviewed: 01/28/2013 °Elsevier Interactive Patient Education ©2016 Elsevier Inc[employer] ° °

## 2015-09-30 ENCOUNTER — Inpatient Hospital Stay: Payer: Self-pay

## 2016-11-30 ENCOUNTER — Emergency Department (HOSPITAL_COMMUNITY): Payer: Self-pay

## 2016-11-30 ENCOUNTER — Inpatient Hospital Stay (HOSPITAL_COMMUNITY)
Admission: AD | Admit: 2016-11-30 | Discharge: 2016-12-05 | DRG: 885 | Disposition: A | Discharge status: Home / Self Care | Payer: Federal, State, Local not specified - Other | Attending: Psychiatry | Admitting: Psychiatry

## 2016-11-30 ENCOUNTER — Emergency Department (HOSPITAL_COMMUNITY)
Admission: EM | Admit: 2016-11-30 | Discharge: 2016-11-30 | Disposition: A | Discharge status: Other Acute Inpatient Hospital | Payer: Self-pay | Attending: Emergency Medicine | Admitting: Emergency Medicine

## 2016-11-30 ENCOUNTER — Encounter (HOSPITAL_COMMUNITY): Payer: Self-pay | Admitting: *Deleted

## 2016-11-30 ENCOUNTER — Encounter (HOSPITAL_COMMUNITY): Payer: Self-pay | Admitting: Emergency Medicine

## 2016-11-30 DIAGNOSIS — Z23 Encounter for immunization: Secondary | ICD-10-CM | POA: Insufficient documentation

## 2016-11-30 DIAGNOSIS — R110 Nausea: Secondary | ICD-10-CM | POA: Diagnosis present

## 2016-11-30 DIAGNOSIS — Z79899 Other long term (current) drug therapy: Secondary | ICD-10-CM | POA: Diagnosis not present

## 2016-11-30 DIAGNOSIS — Y9389 Activity, other specified: Secondary | ICD-10-CM | POA: Insufficient documentation

## 2016-11-30 DIAGNOSIS — F29 Unspecified psychosis not due to a substance or known physiological condition: Principal | ICD-10-CM | POA: Diagnosis present

## 2016-11-30 DIAGNOSIS — IMO0002 Reserved for concepts with insufficient information to code with codable children: Secondary | ICD-10-CM

## 2016-11-30 DIAGNOSIS — F24 Shared psychotic disorder: Secondary | ICD-10-CM | POA: Diagnosis not present

## 2016-11-30 DIAGNOSIS — X780XXA Intentional self-harm by sharp glass, initial encounter: Secondary | ICD-10-CM | POA: Insufficient documentation

## 2016-11-30 DIAGNOSIS — Z915 Personal history of self-harm: Secondary | ICD-10-CM | POA: Diagnosis not present

## 2016-11-30 DIAGNOSIS — Y929 Unspecified place or not applicable: Secondary | ICD-10-CM | POA: Insufficient documentation

## 2016-11-30 DIAGNOSIS — F419 Anxiety disorder, unspecified: Secondary | ICD-10-CM | POA: Diagnosis present

## 2016-11-30 DIAGNOSIS — F15959 Other stimulant use, unspecified with stimulant-induced psychotic disorder, unspecified: Secondary | ICD-10-CM | POA: Diagnosis present

## 2016-11-30 DIAGNOSIS — Z7289 Other problems related to lifestyle: Secondary | ICD-10-CM

## 2016-11-30 DIAGNOSIS — F1590 Other stimulant use, unspecified, uncomplicated: Secondary | ICD-10-CM | POA: Diagnosis present

## 2016-11-30 DIAGNOSIS — S2191XA Laceration without foreign body of unspecified part of thorax, initial encounter: Secondary | ICD-10-CM

## 2016-11-30 DIAGNOSIS — Y998 Other external cause status: Secondary | ICD-10-CM | POA: Insufficient documentation

## 2016-11-30 DIAGNOSIS — S21119A Laceration without foreign body of unspecified front wall of thorax without penetration into thoracic cavity, initial encounter: Secondary | ICD-10-CM | POA: Insufficient documentation

## 2016-11-30 DIAGNOSIS — G4700 Insomnia, unspecified: Secondary | ICD-10-CM | POA: Diagnosis present

## 2016-11-30 LAB — COMPREHENSIVE METABOLIC PANEL
ALK PHOS: 76 U/L (ref 38–126)
ALT: 46 U/L (ref 17–63)
AST: 32 U/L (ref 15–41)
Albumin: 4 g/dL (ref 3.5–5.0)
Anion gap: 11 (ref 5–15)
BUN: 17 mg/dL (ref 6–20)
CHLORIDE: 100 mmol/L — AB (ref 101–111)
CO2: 23 mmol/L (ref 22–32)
Calcium: 9.2 mg/dL (ref 8.9–10.3)
Creatinine, Ser: 0.8 mg/dL (ref 0.61–1.24)
GFR calc Af Amer: >60 mL/min (ref >60)
GFR calc non Af Amer: >60 mL/min (ref >60)
Glucose, Bld: 86 mg/dL (ref 65–99)
Potassium: 3.7 mmol/L (ref 3.5–5.1)
SODIUM: 134 mmol/L — AB (ref 135–145)
Total Bilirubin: 1.2 mg/dL (ref 0.3–1.2)
Total Protein: 8.3 g/dL — ABNORMAL HIGH (ref 6.5–8.1)

## 2016-11-30 LAB — CBC
HCT: 38 % — ABNORMAL LOW (ref 39.0–52.0)
Hemoglobin: 12.8 g/dL — ABNORMAL LOW (ref 13.0–17.0)
MCH: 29.5 pg (ref 26.0–34.0)
MCHC: 33.7 g/dL (ref 30.0–36.0)
MCV: 87.6 fL (ref 78.0–100.0)
PLATELETS: 419 10*3/uL — AB (ref 150–400)
RBC: 4.34 MIL/uL (ref 4.22–5.81)
RDW: 12.9 % (ref 11.5–15.5)
WBC: 7.3 10*3/uL (ref 4.0–10.5)

## 2016-11-30 LAB — RAPID URINE DRUG SCREEN, HOSP PERFORMED
AMPHETAMINES: POSITIVE — AB
BARBITURATES: NOT DETECTED
Benzodiazepines: NOT DETECTED
Cocaine: NOT DETECTED
Opiates: NOT DETECTED
Tetrahydrocannabinol: NOT DETECTED

## 2016-11-30 LAB — ETHANOL: Alcohol, Ethyl (B): <5 mg/dL (ref <5)

## 2016-11-30 LAB — ACETAMINOPHEN LEVEL: Acetaminophen (Tylenol), Serum: <10 ug/mL — ABNORMAL LOW (ref 10–30)

## 2016-11-30 LAB — SALICYLATE LEVEL: Salicylate Lvl: <7 mg/dL (ref 2.8–30.0)

## 2016-11-30 MED ORDER — MAGNESIUM HYDROXIDE 400 MG/5ML PO SUSP: 30.0000 mL | Freq: Every day | ORAL | Status: DC | PRN

## 2016-11-30 MED ORDER — ONDANSETRON HCL 4 MG PO TABS: 4.0000 mg | ORAL_TABLET | Freq: Three times a day (TID) | ORAL | Status: DC | PRN

## 2016-11-30 MED ORDER — ACETAMINOPHEN 325 MG PO TABS: 650.0000 mg | ORAL_TABLET | Freq: Four times a day (QID) | ORAL | Status: DC | PRN

## 2016-11-30 MED ORDER — ZOLPIDEM TARTRATE 5 MG PO TABS: 5.0000 mg | ORAL_TABLET | Freq: Every evening | ORAL | Status: DC | PRN

## 2016-11-30 MED ORDER — ALUM & MAG HYDROXIDE-SIMETH 200-200-20 MG/5ML PO SUSP: 30.0000 mL | Freq: Four times a day (QID) | ORAL | Status: DC | PRN

## 2016-11-30 MED ORDER — BACITRACIN ZINC 500 UNIT/GM EX OINT
1.0000 "application " | TOPICAL_OINTMENT | Freq: Two times a day (BID) | CUTANEOUS | Status: DC
  Administered 2016-11-30: 1 via TOPICAL
  Filled 2016-11-30: qty 0.9

## 2016-11-30 MED ORDER — BACITRACIN ZINC 500 UNIT/GM EX OINT
1.0000 "application " | TOPICAL_OINTMENT | Freq: Two times a day (BID) | CUTANEOUS | Status: DC
  Administered 2016-12-03 – 2016-12-04 (×3): 1 via TOPICAL
  Filled 2016-11-30 (×2): qty 28.35

## 2016-11-30 MED ORDER — ZOLPIDEM TARTRATE 5 MG PO TABS
5.0000 mg | ORAL_TABLET | Freq: Every evening | ORAL | Status: DC | PRN
Start: 2016-11-30 — End: 2016-12-05

## 2016-11-30 MED ORDER — TETANUS-DIPHTH-ACELL PERTUSSIS 5-2.5-18.5 LF-MCG/0.5 IM SUSP
0.5000 mL | Freq: Once | INTRAMUSCULAR | Status: AC
  Administered 2016-11-30: 0.5 mL via INTRAMUSCULAR
  Filled 2016-11-30: qty 0.5

## 2016-11-30 MED ORDER — NICOTINE 21 MG/24HR TD PT24
21.0000 mg | MEDICATED_PATCH | Freq: Every day | TRANSDERMAL | Status: DC
Start: 2016-12-01 — End: 2016-11-30

## 2016-11-30 MED ORDER — IBUPROFEN 400 MG PO TABS: 600.0000 mg | ORAL_TABLET | Freq: Three times a day (TID) | ORAL | Status: DC | PRN

## 2016-11-30 MED ORDER — ENSURE ENLIVE PO LIQD
237.0000 mL | Freq: Two times a day (BID) | ORAL | Status: DC
  Administered 2016-12-03 – 2016-12-05 (×4): 237 mL via ORAL

## 2016-11-30 MED ORDER — NICOTINE 21 MG/24HR TD PT24: 21.0000 mg | MEDICATED_PATCH | Freq: Every day | TRANSDERMAL | Status: DC

## 2016-11-30 NOTE — ED Provider Notes (Signed)
Screening labs reassuring.  UDS + for amphetamines. Patient remains stable here.  TTS has evaluated patient-- recommends inpatient.  Will seek placement.   Garlon Hatchet, PA-C 11/30/16 1412    Jacalyn Lefevre, MD 11/30/16 [phone]

## 2016-11-30 NOTE — ED Notes (Signed)
TTS in progress at bedside. Interpreter being used.
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
## 2016-11-30 NOTE — ED Provider Notes (Signed)
Patient pleasant cooperative alert Glasgow Coma Score 15 ambulates without difficulty . Stable for [facility] accepting physician Dr.Cobos Results for orders placed or performed during the hospital encounter of 11/30/16  CBC  Result Value Ref Range   WBC 7.3 4.0 - 10.5 K/uL   RBC 4.34 4.22 - 5.81 MIL/uL   Hemoglobin 12.8 (L) 13.0 - 17.0 g/dL   HCT 40.9 (L) 81.1 - 91.4 %   MCV 87.6 78.0 - 100.0 fL   MCH 29.5 26.0 - 34.0 pg   MCHC 33.7 30.0 - 36.0 g/dL   RDW 78.2 95.6 - 21.3 %   Platelets 419 (H) 150 - 400 K/uL  Comprehensive metabolic panel  Result Value Ref Range   Sodium 134 (L) 135 - 145 mmol/L   Potassium 3.7 3.5 - 5.1 mmol/L   Chloride 100 (L) 101 - 111 mmol/L   CO2 23 22 - 32 mmol/L   Glucose, Bld 86 65 - 99 mg/dL   BUN 17 6 - 20 mg/dL   Creatinine, Ser 0.86 0.61 - 1.24 mg/dL   Calcium 9.2 8.9 - 57.8 mg/dL   Total Protein 8.3 (H) 6.5 - 8.1 g/dL   Albumin 4.0 3.5 - 5.0 g/dL   AST 32 15 - 41 U/L   ALT 46 17 - 63 U/L   Alkaline Phosphatase 76 38 - 126 U/L   Total Bilirubin 1.2 0.3 - 1.2 mg/dL   GFR calc non Af Amer >60 >60 mL/min   GFR calc Af Amer >60 >60 mL/min   Anion gap 11 5 - 15  Ethanol  Result Value Ref Range   Alcohol, Ethyl (B) <5 <5 mg/dL  Rapid urine drug screen (hospital performed)  Result Value Ref Range   Opiates NONE DETECTED NONE DETECTED   Cocaine NONE DETECTED NONE DETECTED   Benzodiazepines NONE DETECTED NONE DETECTED   Amphetamines POSITIVE (A) NONE DETECTED   Tetrahydrocannabinol NONE DETECTED NONE DETECTED   Barbiturates NONE DETECTED NONE DETECTED  Acetaminophen level  Result Value Ref Range   Acetaminophen (Tylenol), Serum <10 (L) 10 - 30 ug/mL  Salicylate level  Result Value Ref Range   Salicylate Lvl <7.0 2.8 - 30.0 mg/dL   Dg Chest 2 View  Result Date: 11/30/2016 CLINICAL DATA:  Self-inflicted stab wound to the left upper chest. EXAM: CHEST  2 VIEW COMPARISON:  Radiograph 01/19/2013 FINDINGS: No pneumothorax. The cardiomediastinal  contours are normal. The lungs are clear. Pulmonary vasculature is normal. No consolidation or pleural effusion. Calcified granuloma projects over the right upper chest, unchanged. No acute osseous abnormalities are seen. IMPRESSION: No pneumothorax or acute traumatic injury. Electronically Signed   By: Rubye Oaks M.D.   On: 11/30/2016 06:24     Doug Sou, MD 11/30/16 [phone]
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
## 2016-11-30 NOTE — ED Notes (Signed)
Attempted to complete telepsych, unable to complete d/t problems with telepysch machine, plan to complete when pt is moved to another room in POD [facility] to see if the pt location will help with with the technology portion of completing the tele exam, pt aware

## 2016-11-30 NOTE — Progress Notes (Signed)
Patient meets criteria for inpatient treatment. CSW faxed referrals to the following inpatient facilities for review:  [facility], [place], [address], [place], [place], [place]   TTS will continue to seek bed placement.  Baldo Daub MSW, LCSWA CSW Disposition [phone]

## 2016-11-30 NOTE — ED Notes (Signed)
Meal tray given to pt.
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
## 2016-11-30 NOTE — ED Notes (Signed)
Patient taken to xray.

## 2016-11-30 NOTE — ED Notes (Addendum)
IVC papers faxed to [facility], original copy placed in red bin in med room of Pod F.
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
## 2016-11-30 NOTE — Progress Notes (Signed)
Patient has been accepted at [facility] to Dr. Jama Flavors, accepting provider Assunta Found NP, to Room 303-1. ETA 20:00. [facility] RN Marylene Land was informed.  Melbourne Abts, MSW, LCSWA Clinical social worker in disposition [facility] TTS Office [phone] and [phone] 11/30/2016 6:09 PM

## 2016-11-30 NOTE — ED Notes (Signed)
Wiped with normal saline, betadine and tegaderm applied over the area.

## 2016-11-30 NOTE — Tx Team (Signed)
Initial Treatment Plan 11/30/2016 11:22 PM [name] [id number]    PATIENT STRESSORS: Financial difficulties Other: Pt wishes to return to [place]   PATIENT STRENGTHS: Average or above average intelligence Physical Health   PATIENT IDENTIFIED PROBLEMS: Depression  Suicidal Ideation  Substance abuse  "Get better and return home to [place]"               DISCHARGE CRITERIA:  Adequate post-discharge living arrangements Improved stabilization in mood, thinking, and/or behavior Motivation to continue treatment in a less acute level of care Need for constant or close observation no longer present Verbal commitment to aftercare and medication compliance  PRELIMINARY DISCHARGE PLAN: Outpatient therapy Placement in alternative living arrangements  PATIENT/FAMILY INVOLVEMENT: This treatment plan has been presented to and reviewed with the patient, [name].  The patient and family have been given the opportunity to ask questions and make suggestions.  Juliann Pares, RN 11/30/2016, 11:22 PM

## 2016-11-30 NOTE — ED Triage Notes (Signed)
Patient with laceration to his left chest.  Patient flagged PD down and EMS was called.  Patient refused to state how this happened.  Laceration to left chest, appox 1cm deep, bleeding controlled.
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
## 2016-11-30 NOTE — ED Notes (Signed)
IVC papers delivered to pt, copies with blue folder with RN

## 2016-12-01 DIAGNOSIS — F29 Unspecified psychosis not due to a substance or known physiological condition: Principal | ICD-10-CM

## 2016-12-01 MED ORDER — LORAZEPAM 1 MG PO TABS: 1.0000 mg | ORAL_TABLET | Freq: Four times a day (QID) | ORAL | Status: DC | PRN

## 2016-12-01 MED ORDER — HALOPERIDOL 5 MG PO TABS: 5.0000 mg | ORAL_TABLET | Freq: Four times a day (QID) | ORAL | Status: DC | PRN

## 2016-12-01 MED ORDER — LORAZEPAM 2 MG/ML IJ SOLN: 1.0000 mg | Freq: Four times a day (QID) | INTRAMUSCULAR | Status: DC | PRN

## 2016-12-01 MED ORDER — HALOPERIDOL LACTATE 5 MG/ML IJ SOLN: 5.0000 mg | Freq: Four times a day (QID) | INTRAMUSCULAR | Status: DC | PRN

## 2016-12-01 MED ORDER — DIPHENHYDRAMINE HCL 25 MG PO CAPS: 50.0000 mg | ORAL_CAPSULE | Freq: Four times a day (QID) | ORAL | Status: DC | PRN

## 2016-12-01 MED ORDER — DIPHENHYDRAMINE HCL 50 MG/ML IJ SOLN: 50.0000 mg | Freq: Four times a day (QID) | INTRAMUSCULAR | Status: DC | PRN

## 2016-12-01 NOTE — [facility] Group Notes (Signed)
[facility] LCSW Group Therapy Note  12/01/2016 at  9:10 to 10:00 AM  Type of Therapy and Topic:  Group Therapy: Avoiding Self-Sabotaging and Enabling Behaviors  Participation Level:  Did Not Attend; invited to participate yet did not despite overhead announcement and encouragement by staff     Catherine C Harrill, LCSW 

## 2016-12-01 NOTE — Plan of Care (Signed)
Problem: Coping: Goal: Ability to verbalize frustrations and anger appropriately will improve Outcome: Not Progressing Patient was pacing up and down hallways this morning, then proceeded towards the door, asked to leave, and was becoming increasingly agitated.  Patient was able to deescalate after talking with a Spanish speaking staff member.  Patient refused offers of medications for anxiety. Goal: Ability to demonstrate self-control will improve Outcome: Not Progressing Patient became increasingly agitated and wanted to leave hospital this morning, but is currently calm and cooperative.  Comments: Patient with blunted affect, is reclusive to his room and comes out only for meals, denies SI/HI/AVH, and is focused on leaving hospital.  Patient was encouraged to complete self inventory sheet, but did not.  Q15 minute safety checks are in place, pt ate all of his breakfast and all of his lunch as well, and denies having any other concerns.  Will continue to monitor.

## 2016-12-01 NOTE — [facility] Counselor (Signed)
Request made to RN and [facility] staff to retrieve patient's cell phone from locker to obtain mother number in [place].   Carney Bern, LCSW

## 2016-12-01 NOTE — Progress Notes (Signed)
D.  Pt pleasant on approach, no complaints voiced.  Pt did not attend evening AA group, Pt speaks very little [place].  Pt denies SI/HI/AVH at this time.  Pt went to bed at beginning of shift.  A.  Support and encouragement offered, medication given as ordered.   R.  Pt remains safe on the unit, will continue to monitor.

## 2016-12-01 NOTE — [facility] Counselor (Signed)
PSA attempt was unsuccessful with patient. CSW approached patient as he was leaving consult with physician. Will attempt to complete tomorrow as patient is currently hyper focused on returning to [place] asap and refusing to cooperate in dialogue with both physician and CSW via electronic interpreter.   Carney Bern, LCSW

## 2016-12-01 NOTE — H&P (Addendum)
Psychiatric Admission Assessment Adult  Patient Identification: [name] MRN:  [id number] Date of Evaluation:  12/01/2016 Chief Complaint:  Suicidal behavior Principal Diagnosis: Psychotic disorder                                        SIPD Diagnosis:   Patient Active Problem List   Diagnosis Date Noted  . MDD (major depressive disorder), single episode, severe ([place]) [F32.2] 11/30/2016   History of Present Illness: 36 y.o Hispanic male, single, works in [employer]. Presented via emergency services. Flagged down the police and requested for help. Self inflicted wound on his left chest. 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 UDS positive for amphetamine. No alcohol. No critical labs.  At the ER, patient was very guarded. Did not want to disclose how he sustained the injury to his chest. Says he wants to be deported back to [place]. Patient was involuntarily committed at the ER. I interviewed him via an interpreter. Patient was very guarded and did not want to talk. Says he has told his story and does not want any further questioning. When probed about feeling of persecution, he responded that he is scared of anything. Repeatedly states that he wants to be taken to jail and then deported back to [place]. When probed about stressors, patient was mute. Remained mute when probed about substance use. When probed about how he sustained the chest wound, patient remained mute.    Total Time spent with patient: 45 minutes  Past Psychiatric History: Very poor historian. Dismissed any previous psychiatric hospitalization. Would not respond when probed specifically about past suicidal behavior  Is the patient at risk to self? Yes.    Has the patient been a risk to self in the past 6 months? Unable to assess at this time.   Has the patient been a risk to self within the distant past? Unable to assess at this time.   Is the patient a risk to others? Unable to assess at this time.   Has  the patient been a risk to others in the past 6 months? Unable to assess at this time.   Has the patient been a risk to others within the distant past? Unable to assess at this time.    Prior Inpatient Therapy:   Prior Outpatient Therapy:    Alcohol Screening: 1. How often do you have a drink containing alcohol?: Monthly or less 2. How many drinks containing alcohol do you have on a typical day when you are drinking?: 3 or 4 3. How often do you have six or more drinks on one occasion?: Less than monthly Preliminary Score: 2 4. How often during the last year have you found that you were not able to stop drinking once you had started?: Never 5. How often during the last year have you failed to do what was normally expected from you becasue of drinking?: Never 6. How often during the last year have you needed a first drink in the morning to get yourself going after a heavy drinking session?: Never 7. How often during the last year have you had a feeling of guilt of remorse after drinking?: Never 8. How often during the last year have you been unable to remember what happened the night before because you had been drinking?: Never 9. Have you or someone else been injured as a result of your drinking?: No 10. Has a relative  or friend or a doctor or another health worker been concerned about your drinking or suggested you cut down?: No Alcohol Use Disorder Identification Test Final Score (AUDIT): 3 Brief Intervention: AUDIT score less than 7 or less-screening does not suggest unhealthy drinking-brief intervention not indicated Substance Abuse History in the last 12 months:  Unable to assess at this time.  Consequences of Substance Abuse: Unable to assess at this time.  Previous Psychotropic Medications: Unable to assess at this time.  Psychological Evaluations: Unable to assess at this time.  Past Medical History: History reviewed. No pertinent past medical history.  Past Surgical History:   Procedure Laterality Date  . NO PAST SURGERIES    . ORIF ULNAR FRACTURE Left 01/28/2013   Procedure:  LEFT ULNAR OPEN REDUCTION INTERNAL FIXATION (ORIF) ;  Surgeon: Linna Hoff, MD;  Location: [facility];  Service: Orthopedics;  Laterality: Left;   Family History: History reviewed. No pertinent family history. Family Psychiatric  History: Unable to assess at this time.  Tobacco Screening: Have you used any form of tobacco in the last 30 days? (Cigarettes, Smokeless Tobacco, Cigars, and/or Pipes): No Social History:  History  Alcohol Use  . Yes    Comment: rare     History  Drug Use No    Additional Social History:        Allergies:  No Known Allergies Lab Results:  Results for orders placed or performed during the hospital encounter of 11/30/16 (from the past 48 hour(s))  CBC     Status: Abnormal   Collection Time: 11/30/16  6:49 AM  Result Value Ref Range   WBC 7.3 4.0 - 10.5 K/uL   RBC 4.34 4.22 - 5.81 MIL/uL   Hemoglobin 12.8 (L) 13.0 - 17.0 g/dL   HCT 38.0 (L) 39.0 - 52.0 %   MCV 87.6 78.0 - 100.0 fL   MCH 29.5 26.0 - 34.0 pg   MCHC 33.7 30.0 - 36.0 g/dL   RDW 12.9 11.5 - 15.5 %   Platelets 419 (H) 150 - 400 K/uL  Comprehensive metabolic panel     Status: Abnormal   Collection Time: 11/30/16  6:49 AM  Result Value Ref Range   Sodium 134 (L) 135 - 145 mmol/L   Potassium 3.7 3.5 - 5.1 mmol/L   Chloride 100 (L) 101 - 111 mmol/L   CO2 23 22 - 32 mmol/L   Glucose, Bld 86 65 - 99 mg/dL   BUN 17 6 - 20 mg/dL   Creatinine, Ser 0.80 0.61 - 1.24 mg/dL   Calcium 9.2 8.9 - 10.3 mg/dL   Total Protein 8.3 (H) 6.5 - 8.1 g/dL   Albumin 4.0 3.5 - 5.0 g/dL   AST 32 15 - 41 U/L   ALT 46 17 - 63 U/L   Alkaline Phosphatase 76 38 - 126 U/L   Total Bilirubin 1.2 0.3 - 1.2 mg/dL   GFR calc non Af Amer >60 >60 mL/min   GFR calc Af Amer >60 >60 mL/min    Comment: (NOTE) The eGFR has been calculated using the CKD EPI equation. This calculation has not been validated in all clinical  situations. eGFR's persistently <60 mL/min signify possible Chronic Kidney Disease.    Anion gap 11 5 - 15  Ethanol     Status: None   Collection Time: 11/30/16  6:50 AM  Result Value Ref Range   Alcohol, Ethyl (B) <5 <5 mg/dL    Comment:        LOWEST  DETECTABLE LIMIT FOR SERUM ALCOHOL IS 5 mg/dL FOR MEDICAL PURPOSES ONLY   Acetaminophen level     Status: Abnormal   Collection Time: 11/30/16  6:50 AM  Result Value Ref Range   Acetaminophen (Tylenol), Serum <10 (L) 10 - 30 ug/mL    Comment:        THERAPEUTIC CONCENTRATIONS VARY SIGNIFICANTLY. A RANGE OF 10-30 ug/mL MAY BE AN EFFECTIVE CONCENTRATION FOR MANY PATIENTS. HOWEVER, SOME ARE BEST TREATED AT CONCENTRATIONS OUTSIDE THIS RANGE. ACETAMINOPHEN CONCENTRATIONS >150 ug/mL AT 4 HOURS AFTER INGESTION AND >50 ug/mL AT 12 HOURS AFTER INGESTION ARE OFTEN ASSOCIATED WITH TOXIC REACTIONS.   Salicylate level     Status: None   Collection Time: 11/30/16  6:50 AM  Result Value Ref Range   Salicylate Lvl <8.0 2.8 - 30.0 mg/dL  Rapid urine drug screen (hospital performed)     Status: Abnormal   Collection Time: 11/30/16  8:08 AM  Result Value Ref Range   Opiates NONE DETECTED NONE DETECTED   Cocaine NONE DETECTED NONE DETECTED   Benzodiazepines NONE DETECTED NONE DETECTED   Amphetamines POSITIVE (A) NONE DETECTED   Tetrahydrocannabinol NONE DETECTED NONE DETECTED   Barbiturates NONE DETECTED NONE DETECTED    Comment:        DRUG SCREEN FOR MEDICAL PURPOSES ONLY.  IF CONFIRMATION IS NEEDED FOR ANY PURPOSE, NOTIFY LAB WITHIN 5 DAYS.        LOWEST DETECTABLE LIMITS FOR URINE DRUG SCREEN Drug Class       Cutoff (ng/mL) Amphetamine      1000 Barbiturate      200 Benzodiazepine   998 Tricyclics       338 Opiates          300 Cocaine          300 THC              50     Blood Alcohol level:  Lab Results  Component Value Date   ETH <5 25/07/3974    Metabolic Disorder Labs:  No results found for: HGBA1C, MPG No  results found for: PROLACTIN No results found for: CHOL, TRIG, HDL, CHOLHDL, VLDL, LDLCALC  Current Medications: Current Facility-Administered Medications  Medication Dose Route Frequency Provider Last Rate Last Dose  . acetaminophen (TYLENOL) tablet 650 mg  650 mg Oral Q6H PRN Rankin, Shuvon B, NP      . bacitracin ointment 1 application  1 application Topical BID Rankin, Shuvon B, NP      . feeding supplement (ENSURE ENLIVE) (ENSURE ENLIVE) liquid 237 mL  237 mL Oral BID BM Cobos, Fernando A, MD      . magnesium hydroxide (MILK OF MAGNESIA) suspension 30 mL  30 mL Oral Daily PRN Rankin, Shuvon B, NP      . ondansetron (ZOFRAN) tablet 4 mg  4 mg Oral Q8H PRN Rankin, Shuvon B, NP      . zolpidem (AMBIEN) tablet 5 mg  5 mg Oral QHS PRN Rankin, Shuvon B, NP       PTA Medications: Prescriptions Prior to Admission  Medication Sig Dispense Refill Last Dose  . ciprofloxacin (CIPRO) 500 MG tablet Take 1 tablet (500 mg total) by mouth 2 (two) times daily. 28 tablet 0   . HYDROcodone-acetaminophen (NORCO/VICODIN) 5-325 MG tablet Take 1-2 tablets by mouth every 4 (four) hours as needed. 20 tablet 0   . metroNIDAZOLE (FLAGYL) 500 MG tablet Take 1 tablet (500 mg total) by mouth 2 (two) times daily. 28 tablet  0   . ondansetron (ZOFRAN) 4 MG tablet Take 1 tablet (4 mg total) by mouth every 6 (six) hours. 12 tablet 0     Musculoskeletal: Strength & Muscle Tone: within normal limits Gait & Station: normal Patient leans: N/A  Psychiatric Specialty Exam: Physical Exam  Constitutional: He appears well-developed and well-nourished.  Respiratory: Effort normal.  Neurological: He is alert.  Psychiatric:  As above    ROS  Blood pressure 105/72, pulse 82, temperature 98.4 F (36.9 C), resp. rate 18, height _0  (1.702 m), weight 70.8 kg (156 lb).Body mass index is 24.43 kg/m.  General Appearance: Malodorous, tense demeanor, markedly guarded  Eye Contact:  Avoids eye contact  Speech:  Mute mostly.  Not pressured when he speaks.   Volume:  Normal  Mood:  Anxious, Dysphoric and Irritable  Affect:  Congruent  Thought Process:  Poverty of thought.   Orientation:  Unable to assess at this time.   Thought Content:  Unable to assess at this time.   Suicidal Thoughts:  Unable to assess at this time.   Homicidal Thoughts: Unable to assess at this time.   Memory:  Unable to assess at this time.   Judgement:  Poor   Insight:  Lacking  Psychomotor Activity:  Restlessness  Concentration:  Unable to assess at this time.   Recall:  Unable to assess at this time.   Fund of Knowledge:  Unable to assess at this time.   Language:  Fair  Akathisia:  Negative  Handed:    AIMS (if indicated):     Assets:  Physical Health  ADL's:  Impaired  Cognition:  WNL  Sleep:  Number of Hours: 6.25    Treatment Plan Summary: Patient presented with self injurious behavior. He is intoxicated with methamphetamine. Patient is very guarded and very tense. He is preoccupied with need to be deported back to [place]. Belief and actions are likely based on abnormal delusion. Likely cause of psychosis is methamphetamine induced. Patient does not have any insight and not cooperative with care. We would use PRN antipsyhcotic agents and benzodiazepines to manage any threat to self or others. Would continue IVC.   Psychiatric: Psychotic disorder  Medical:  Psychosocial:  Limited support  PLAN: 1. Haldol 5 mg q6h PRN for agitation 2. Ativan 1 mg q6h PRN for acute agitation 3. Encourage unit groups and activities 4. Monitor mood, behavior and interaction with peers   Observation Level/Precautions:  Elopement 15 minute checks  Laboratory:    Psychotherapy:    Medications:    Consultations:    Discharge Concerns:    Estimated LOS:  Other:     Physician Treatment Plan for Primary Diagnosis: <principal problem not specified> Long Term Goal(s): Improvement in symptoms so as ready for discharge  Short Term  Goals: Ability to identify changes in lifestyle to reduce recurrence of condition will improve, Ability to verbalize feelings will improve, Ability to disclose and discuss suicidal ideas, Ability to demonstrate self-control will improve, Ability to identify and develop effective coping behaviors will improve, Ability to maintain clinical measurements within normal limits will improve, Compliance with prescribed medications will improve and Ability to identify triggers associated with substance abuse/mental health issues will improve  Physician Treatment Plan for Secondary Diagnosis: Active Problems:   MDD (major depressive disorder), single episode, severe ([place])  Long Term Goal(s): Improvement in symptoms so as ready for discharge  Short Term Goals: Ability to identify changes in lifestyle to reduce recurrence of condition will improve,  Ability to verbalize feelings will improve, Ability to disclose and discuss suicidal ideas, Ability to demonstrate self-control will improve, Ability to identify and develop effective coping behaviors will improve, Ability to maintain clinical measurements within normal limits will improve, Compliance with prescribed medications will improve and Ability to identify triggers associated with substance abuse/mental health issues will improve  I certify that inpatient services furnished can reasonably be expected to improve the patient's condition.    [place], MD 9/15/201811:05 AM

## 2016-12-01 NOTE — [facility] Suicide Risk Assessment (Signed)
[facility] Admission Suicide Risk Assessment   Nursing information obtained from:  Patient Demographic factors:  Male, Low socioeconomic status, Living alone, Unemployed Current Mental Status:  Suicidal ideation indicated by others, Self-harm behaviors Loss Factors:  Financial problems / change in socioeconomic status Historical Factors:  Impulsivity Risk Reduction Factors:  Religious beliefs about death  Total Time spent with patient: 30 minutes Principal Problem: Psychotic disorder Diagnosis:   Patient Active Problem List   Diagnosis Date Noted  . 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 Presented via emergency services. Flagged down the police and requested for help. Self inflicted wound on his left chest. 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 UDS positive for amphetamine. No alcohol. No critical labs.  At the ER, patient was very guarded. Did not want to disclose how he sustained the injury to his chest. Says he wants to be deported back to [place]. Patient was involuntarily committed at the ER. I interviewed him via an interpreter. Patient was very guarded and did not want to talk. Says he has told his story and does not want any further questioning. When probed about feeling of persecution, he responded that he is scared of anything. Repeatedly states that he wants to be taken to jail and then deported back to [place]. When probed about stressors, patient was mute. Remained mute when probed about substance use. When probed about how he sustained the chest wound, patient remained mute.   Continued Clinical Symptoms:  Alcohol Use Disorder Identification Test Final Score (AUDIT): 3 The "Alcohol Use Disorders Identification Test", Guidelines for Use in Primary Care, Second Edition.  World Science writer [facility]). Score between 0-7:  no or low risk or alcohol related problems. Score between 8-15:  moderate risk of alcohol  related problems. Score between 16-19:  high risk of alcohol related problems. Score 20 or above:  warrants further diagnostic evaluation for alcohol dependence and treatment.   CLINICAL FACTORS:   Alcohol/Substance Abuse/Dependencies   Musculoskeletal: Strength & Muscle Tone: within normal limits Gait & Station: normal Patient leans: N/A  Psychiatric Specialty Exam: Physical Exam  ROS  Blood pressure 105/72, pulse 82, temperature 98.4 F (36.9 C), resp. rate 18, height  (1.702 m), weight 70.8 kg (156 lb).Body mass index is 24.43 kg/m.  General Appearance: As in H&P  Eye Contact:  As in H&P  Speech:  As in H&P  Volume:  As in H&P  Mood:  As in H&P  Affect:  As in H&P  Thought Process:  As in H&P  Orientation:  As in H&P  Thought Content:  As in H&P  Suicidal Thoughts:  As in H&P  Homicidal Thoughts:  As in H&P  Memory:  As in H&P  Judgement:  As in H&P  Insight:  As in H&P  Psychomotor Activity:  As in H&P  Concentration:  As in H&P  Recall:  As in H&P  Fund of Knowledge:  As in H&P  Language:  As in H&P  Akathisia:  As in H&P  Handed:  As in H&P  AIMS (if indicated):     Assets:  As in H&P  ADL's:  As in H&P  Cognition:  As in H&P  Sleep:  Number of Hours: 6.25      COGNITIVE FEATURES THAT CONTRIBUTE TO RISK:  Closed-mindedness    SUICIDE RISK:   Severe:  Frequent, intense, and enduring suicidal ideation, specific plan, no subjective intent, but some objective markers  of intent (i.e., choice of lethal method), the method is accessible, some limited preparatory behavior, evidence of impaired self-control, severe dysphoria/symptomatology, multiple risk factors present, and few if any protective factors, particularly a lack of social support.  PLAN OF CARE:  As in H&P  I certify that inpatient services furnished can reasonably be expected to improve the patient's condition.   Georgiann Cocker, MD 12/01/2016, 2:22 PM

## 2016-12-02 NOTE — Progress Notes (Signed)
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 Presented via emergency services. Flagged down the police and requested for help. Self inflicted wound on his left chest. 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 UDS positive for amphetamine. No alcohol. No critical labs.   Chart reviewed today. Patient discussed at team today.  Staff reports that isolates self mostly. Coming out a bit but not interactive. Has not expressed any suicidal thoughts. Has not been observed to be responding to internal stimuli. Did not require any PRN medication overnight.  Seen today. Communicated in broken english. Says he is ready to go home. Says he has to be at work tomorrow. No longer perseverating on being deported to [place]. Says he misses home and would visit soon. Could not explain why he hurt self. Denies any current thoughts of suicide. Denies feeling persecuted. Denies hearing or seeing things.    Principal Problem: Psychotic disorder Diagnosis:   Patient Active Problem List   Diagnosis Date Noted  . 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 No pertinent past medical history.  Past Surgical History:  Procedure Laterality Date  . NO PAST SURGERIES    . ORIF ULNAR FRACTURE Left 01/28/2013   Procedure:  LEFT ULNAR OPEN REDUCTION INTERNAL FIXATION (ORIF) ;  Surgeon: Sharma Covert, MD;  Location: [facility] OR;  Service: Orthopedics;  Laterality: Left;   Family History: History reviewed. No pertinent family history. Family Psychiatric  History: As in H&P Social History:  History  Alcohol Use  . Yes    Comment: rare     History  Drug Use No    Social History   Social History  . Marital status: Significant Other    Spouse name: N/A  . Number of  children: N/A  . Years of education: N/A   Social History Main Topics  . Smoking status: Never Smoker  . Smokeless tobacco: Never Used  . Alcohol use Yes     Comment: rare  . Drug use: No  . Sexual activity: Not Asked   Other Topics Concern  . None   Social History Narrative  . None   Additional Social History:       Sleep: Good  Appetite:  Good  Current Medications: Current Facility-Administered Medications  Medication Dose Route Frequency Provider Last Rate Last Dose  . acetaminophen (TYLENOL) tablet 650 mg  650 mg Oral Q6H PRN Rankin, Shuvon B, NP      . bacitracin ointment 1 application  1 application Topical BID Rankin, Shuvon B, NP      . diphenhydrAMINE (BENADRYL) capsule 50 mg  50 mg Oral Q6H PRN Gaetan Spieker, Delight Ovens, MD       Or  . diphenhydrAMINE (BENADRYL) injection 50 mg  50 mg Intramuscular Q6H PRN Dymin Dingledine A, MD      . feeding supplement (ENSURE ENLIVE) (ENSURE ENLIVE) liquid 237 mL  237 mL Oral BID BM Cobos, Fernando A, MD      . haloperidol (HALDOL) tablet 5 mg  5 mg Oral Q6H PRN Syon Tews, Delight Ovens, MD       Or  . haloperidol lactate (HALDOL) injection 5 mg  5 mg Intramuscular Q6H PRN Stevon Gough A, MD      . LORazepam (ATIVAN) tablet 1 mg  1 mg  Oral Q6H PRN Georgiann Cocker, MD       Or  . LORazepam (ATIVAN) injection 1 mg  1 mg Intramuscular Q6H PRN Emali Heyward A, MD      . magnesium hydroxide (MILK OF MAGNESIA) suspension 30 mL  30 mL Oral Daily PRN Rankin, Shuvon B, NP      . ondansetron (ZOFRAN) tablet 4 mg  4 mg Oral Q8H PRN Rankin, Shuvon B, NP      . zolpidem (AMBIEN) tablet 5 mg  5 mg Oral QHS PRN Rankin, Shuvon B, NP        Lab Results: No results found for this or any previous visit (from the past 48 hour(s)).  Blood Alcohol level:  Lab Results  Component Value Date   ETH <5 11/30/2016    Metabolic Disorder Labs: No results found for: HGBA1C, MPG No results found for: PROLACTIN No results found for: CHOL, TRIG,  HDL, CHOLHDL, VLDL, LDLCALC  Physical Findings: AIMS: Facial and Oral Movements Muscles of Facial Expression: None, normal Lips and Perioral Area: None, normal Jaw: None, normal Tongue: None, normal,Extremity Movements Upper (arms, wrists, hands, fingers): None, normal Lower (legs, knees, ankles, toes): None, normal, Trunk Movements Neck, shoulders, hips: None, normal, Overall Severity Severity of abnormal movements (highest score from questions above): None, normal Incapacitation due to abnormal movements: None, normal Patient's awareness of abnormal movements (rate only patient's report): No Awareness, Dental Status Current problems with teeth and/or dentures?: No Does patient usually wear dentures?: No  CIWA:  CIWA-Ar Total: 0 COWS:     Musculoskeletal: Strength & Muscle Tone: within normal limits Gait & Station: normal Patient leans: N/A  Psychiatric Specialty Exam: Physical Exam  Constitutional: He is oriented to person, place, and time. He appears well-developed and well-nourished.  HENT:  Head: Normocephalic and atraumatic.  Respiratory: Effort normal.  Neurological: He is alert and oriented to person, place, and time.  Psychiatric:  As above    ROS  Blood pressure 108/78, pulse 76, temperature 97.7 F (36.5 C), temperature source Oral, resp. rate 16, height  (1.702 m), weight 70.8 kg (156 lb).Body mass index is 24.43 kg/m.  General Appearance:  Casually dressed, observed to have been up and about today. Yesterday he was in bed mostly and had the sheets over his head. More relaxed today.   Eye Contact:  Much better  Speech:  Not pressured or loud.   Volume:  Normal  Mood:  Less dysphoric  Affect:  Restricted  Thought Process:  Linear  Orientation:  Unable to assess at this time.   Thought Content:  Very focused on being discharged. No hallucination in any modality.   Suicidal Thoughts:  Denies any lingering suicidal thoughts  Homicidal Thoughts:  No   Memory:  Unable to assess at this time.   Judgement:  Fair  Insight:  Shallow  Psychomotor Activity:  Less restless today.   Concentration:  Less distracted today.  Recall:  Unable to assess at this time.   Fund of Knowledge:  Unable to assess at this time.   Language:  Fair  Akathisia:  Negative  Handed:    AIMS (if indicated):     Assets:  Physical Health  ADL's:  Better today  Cognition:  WNL  Sleep:  Number of Hours: 6.5     Treatment Plan Summary: Patient is coming off psychoactive stimulants. He is less perplexed and less animated today. He is more interactive today. He has not required any medications so far.  We plan to evaluate him further.    Psychiatric: Psychotic disorder  Medical:  Psychosocial:  Limited support  PLAN: 1. Continue to monitor mood, behavior and interaction with peers  2. Continue to encourage unit groups and activities   Georgiann Cocker, MD 12/02/2016, 1:06 PM

## 2016-12-02 NOTE — Progress Notes (Signed)
NUTRITION ASSESSMENT  Pt identified as at risk on the Malnutrition Screen Tool  INTERVENTION: 1. Supplements: Continue Ensure Enlive po BID, each supplement provides 350 kcal and 20 grams of protein  NUTRITION DIAGNOSIS: Unintentional weight loss related to sub-optimal intake as evidenced by pt report.   Goal: Pt to meet >/= 90% of their estimated nutrition needs.  Monitor:  PO intake  Assessment:  Pt admitted with psych disorder. Pt poor historian and english is not his first language. Per chart review, pt has lost 39 lb since July 2017 (20% wt loss x 1.2 years, insignificant time frame).  Will continue Ensure supplements given weight loss.  Height: Ht Readings from Last 1 Encounters:  11/30/16  (1.702 m)    Weight: Wt Readings from Last 1 Encounters:  11/30/16 156 lb (70.8 kg)    Weight Hx: Wt Readings from Last 10 Encounters:  11/30/16 156 lb (70.8 kg)  09/27/15 195 lb (88.5 kg)  01/28/13 195 lb (88.5 kg)    BMI:  Body mass index is 24.43 kg/m. Pt meets criteria for normal based on current BMI.  Estimated Nutritional Needs: Kcal: 25-30 kcal/kg Protein: > 1 gram protein/kg Fluid: 1 ml/kcal  Diet Order: Diet regular Room service appropriate? Yes; Fluid consistency: Thin Pt is also offered choice of unit snacks mid-morning and mid-afternoon.  Pt is eating as desired.   Lab results and medications reviewed.   Tilda Franco, MS, RD, LDN Pager: [phone] After Hours Pager: [phone]

## 2016-12-02 NOTE — [facility] Group Notes (Signed)
[facility] LCSW Group Therapy Note  12/02/2016  10:15 to 11 AM   Type of Therapy and Topic: Group Therapy: Feelings Around Returning Home & Establishing a Supportive Framework   Participation Level:  Minimal   Description of Group:  Patients first processed thoughts and feelings about up coming discharge. These included fears of upcoming changes, lack of change, new living environments, judgements and expectations from others and overall stigma of MH issues. We then discussed what is a supportive framework? What does it look like feel like and how do I discern it from and unhealthy non-supportive network? Learn how to cope when supports are not helpful and don't support you. Discuss what to do when your family/friends are not supportive.   Therapeutic Goals Addressed in Processing Group:  1. Patient will identify one healthy supportive network that they can use at discharge. 2. Patient will identify one factor of a supportive framework and how to tell it from an unhealthy network. 3. Patient able to identify one coping skill to use when they do not have positive supports from others. 4. Patient will demonstrate ability to communicate their needs through discussion and/or role plays.  Summary of Patient Progress:  Pt was attentive during portions of group session yet was in and out of group room.   Carney Bern, LCSW

## 2016-12-02 NOTE — Plan of Care (Signed)
Problem: Education: Goal: Verbalization of understanding the information provided will improve Outcome: Completed/Met Date Met: 12/02/16 Through interpreter, patient verbalizes understanding of information, education provided.  Problem: Safety: Goal: Periods of time without injury will increase Outcome: Progressing Patient has not engaged in self harm, denies thoughts to do so. No SI.
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
## 2016-12-02 NOTE — Progress Notes (Signed)
Adult Psychoeducational Group Note  Date:  12/02/2016 Time:  12:53 AM  Group Topic/Focus:  Wrap-Up Group:   The focus of this group is to help patients review their daily goal of treatment and discuss progress on daily workbooks.  Participation Level:  Did Not Attend  Participation Quality:  Did not attend  Affect:  Did not attend  Cognitive:  Did not attend  Insight: None  Engagement in Group:  Did not attend  Modes of Intervention:  Did not attend  Additional Comments:  Pt did not attend the AA group tonight.  Felipa Furnace 12/02/2016, 12:53 AM

## 2016-12-02 NOTE — [facility] Group Notes (Signed)
[facility] Group Notes:  (Nursing/MHT/Case Management/Adjunct)  Date:  12/02/2016  Time:  1330  Type of Therapy:  Nurse Education - Meditation Group  Participation Level:  Did Not Attend  Participation Quality:    Affect:    Cognitive:    Insight:    Engagement in Group:    Modes of Intervention:    Summary of Progress/Problems: Patient invited however elected not to attend  Lawrence Marseilles 12/02/2016, 3:06 PM

## 2016-12-03 NOTE — Progress Notes (Signed)
Recreation Therapy Notes  Date: 12/03/16 Time: 0930 Location: 300 Dayroom  Group Topic: Stress Management  Goal Area(s) Addresses:  Patient will verbalize importance of using healthy stress management.  Patient will identify positive emotions associated with healthy stress management.   Intervention: Stress Management  Activity :  Meditation.  LRT introduced the stress management technique of meditation.  Patients were to listen and follow along as Wallace meditation played from the Calm app.    Education:  Stress Management, Discharge Planning.   Education Outcome: Acknowledges edcuation/In group clarification offered/Needs additional education  Clinical Observations/Feedback: Pt did not attend group.   [name], LRT/CTRS         [name] 12/03/2016 12:10 PM 

## 2016-12-03 NOTE — Progress Notes (Signed)
D   Pt is visible on the milieu   He has limited interaction with others due to the language barrier and his intrepetor did not return tonight    He has no complaints and is cooperative with treatment A   Verbal support given    Medications offered    Q 15 min checks R   Pt is safe at present

## 2016-12-03 NOTE — [facility] Group Notes (Signed)
LCSW Group Therapy Note   12/03/2016 1:15pm   Type of Therapy and Topic:  Group Therapy:  Overcoming Obstacles   Participation Level: Pt invited. Did not attend.  Jonathon Jordan, MSW, LCSWA 12/03/2016 5:14 PM
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
## 2016-12-03 NOTE — Progress Notes (Signed)
DAR NOTE: Patient presents with calm affect and pleasant mood.  Denies pain, auditory and visual hallucinations.  Described energy level as normal and concentration as good.Rates depression at 0, hopelessness at 0, and anxiety at 0.  Denies any withdrawal symptoms.  Maintained on routine safety checks.  Medications given as prescribed.  Support and encouragement offered as needed. States goal for today is to get discharge and to return to work."  Patient report feeling much better today.  Patient visible in the dayroom this afternoon Offered no complaint.

## 2016-12-03 NOTE — Progress Notes (Signed)
D.  Pt pleasant on approach, denies complaints at this time.  Pt hopeful for discharge tomorrow, denies SI/HI/AVH, stated had just hoped to be deported so that he can return to [place].   Pt did attend evening wrap up group, needs interpreter, does not wish to have one.  Minimal interaction with peers on unit possibly due to language barrier.  A.  Support and encouragement offered.  R  Pt remains safe on the unit, will continue to monitor.

## 2016-12-04 DIAGNOSIS — F24 Shared psychotic disorder: Secondary | ICD-10-CM

## 2016-12-04 NOTE — BHH Group Notes (Signed)
[facility] Mental Health Association Group Therapy 12/04/2016 1:15pm  Type of Therapy: Mental Health Association Presentation  Participation Level: Active  Participation Quality: Attentive  Affect: Appropriate  Cognitive: Oriented  Insight: Developing/Improving  Engagement in Therapy: Engaged  Modes of Intervention: Discussion, Education and Socialization  Summary of Progress/Problems: Mental Health Association (MHA) Speaker came to talk about his personal journey with mental health. The pt processed ways by which to relate to the speaker. MHA speaker provided handouts and educational information pertaining to groups and services offered by the [facility]. Pt was engaged in speaker's presentation and was receptive to resources provided.    [employer], LCSW 12/04/2016 11:42 AM

## 2016-12-04 NOTE — Progress Notes (Signed)
DAR NOTE: Patient presents with calm affect and pleasant mood.  Denies pain, auditory and visual hallucinations.  Described energy level as normal and concentration as good.  Rates depression at 0, hopelessness at 0, and anxiety at 0.  Maintained on routine safety checks.  Medications given as prescribed.  Support and encouragement offered as needed.  Attended group and participated.  States goal for today is "discharge."  Patient visible in the dayroom socializing with peers.  Offered no complaint.

## 2016-12-04 NOTE — BHH Group Notes (Signed)
Adult Psychoeducational Group Note  Date:  12/04/2016 Time:  10:21 PM  Group Topic/Focus:  Goals Group:   The focus of this group is to help patients establish daily goals to achieve during treatment and discuss how the patient can incorporate goal setting into their daily lives to aide in recovery.  Participation Level:  Active  Participation Quality:  Appropriate  Affect:  Appropriate  Cognitive:  Appropriate  Insight: Appropriate  Engagement in Group:  Engaged  Modes of Intervention:  Activity  Additional Comments:  Pt translator was present in group. Pt stated that his goal everyday is to get discharged and get back to work. Pt was alert and engaged in group discussion.  Dellia Nims 12/04/2016, 10:21 PM

## 2016-12-04 NOTE — Progress Notes (Signed)
[facility] MD Progress Note  12/04/2016 3:58 PM Kelsey Delcarmen-Avila  MRN:  [id number]  Subjective: Cephus reports via a Hispanic interpreter, "I need to get out of here. I need to get back to work. I work to support my mother. I got here because the Micael [place]) evil that live in the house was messing with me, the snakes, ball of fire, my things getting gone, stolen. My watch, computer, no one can understand. I just want to get discharge".   Objective: 36 y.o Hispanic male, single, works in [employer]. Presented via emergency services. Flagged down the police and requested for help. Self inflicted wound on his left chest. 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 UDS positive for amphetamine. No alcohol. No critical labs.   Chart reviewed today. Patient discussed at team today.  Staff reports that he is much more relaxed. Not expressing any suicidal thoughts. Not expressing any thoughts of harm. Grooming self. Has not been observed to be internally stimulated.   Seen today with the assistance with the Hispanic interpreter. Patient accepts his injury was self inflicted. Says he was angry with himself. Says he feels his roommate is using witchcraft against him. Was wanting to hurt himself when he stabbed himself. Describes supernatural attacks while he was at home (seeing a ball of fire, snakes). No such feelings here. Says he is feeling better today compared to when he first came in. Says he is not a violent person. Patient says all he wants to do is get back to work. Wants to gather some money and visit [place]. No longer perseverating on being deported. Not expressing any paranoia here. No feelings of being interfered with here. No hallucination in any modality. Does not feel he needs medications. Volunteered use of methamphetamine but minimized use. Patient states that his friend came to visit him last night. He plans to stay with this his friend upon discharge. He has consented to collateral from is  friend.   Principal Problem: Psychotic disorder Diagnosis:   Patient Active Problem List   Diagnosis Date Noted  . 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 No pertinent past medical history.  Past Surgical History:  Procedure Laterality Date  . NO PAST SURGERIES    . ORIF ULNAR FRACTURE Left 01/28/2013   Procedure:  LEFT ULNAR OPEN REDUCTION INTERNAL FIXATION (ORIF) ;  Surgeon: Sharma Covert, MD;  Location: [facility] OR;  Service: Orthopedics;  Laterality: Left;   Family History: History reviewed. No pertinent family history. Family Psychiatric  History: As in H&P Social History:  History  Alcohol Use  . Yes    Comment: rare     History  Drug Use No    Social History   Social History  . Marital status: Significant Other    Spouse name: N/A  . Number of children: N/A  . Years of education: N/A   Social History Main Topics  . Smoking status: Never Smoker  . Smokeless tobacco: Never Used  . Alcohol use Yes     Comment: rare  . Drug use: No  . Sexual activity: Not Asked   Other Topics Concern  . None   Social History Narrative  . None   Additional Social History:       Sleep: Good  Appetite:  Good  Current Medications: Current Facility-Administered Medications  Medication Dose Route Frequency Provider Last Rate Last Dose  . acetaminophen (TYLENOL)  tablet 650 mg  650 mg Oral Q6H PRN Rankin, Shuvon B, NP      . bacitracin ointment 1 application  1 application Topical BID Rankin, Shuvon B, NP   1 application at 12/04/16 0800  . diphenhydrAMINE (BENADRYL) capsule 50 mg  50 mg Oral Q6H PRN Izediuno, Delight Ovens, MD       Or  . diphenhydrAMINE (BENADRYL) injection 50 mg  50 mg Intramuscular Q6H PRN Izediuno, Vincent A, MD      . feeding supplement (ENSURE ENLIVE) (ENSURE ENLIVE) liquid 237 mL  237 mL Oral BID BM Cobos, Fernando A, MD   237 mL at  12/04/16 1454  . haloperidol (HALDOL) tablet 5 mg  5 mg Oral Q6H PRN Izediuno, Delight Ovens, MD       Or  . haloperidol lactate (HALDOL) injection 5 mg  5 mg Intramuscular Q6H PRN Izediuno, Vincent A, MD      . LORazepam (ATIVAN) tablet 1 mg  1 mg Oral Q6H PRN Izediuno, Delight Ovens, MD       Or  . LORazepam (ATIVAN) injection 1 mg  1 mg Intramuscular Q6H PRN Izediuno, Vincent A, MD      . magnesium hydroxide (MILK OF MAGNESIA) suspension 30 mL  30 mL Oral Daily PRN Rankin, Shuvon B, NP      . ondansetron (ZOFRAN) tablet 4 mg  4 mg Oral Q8H PRN Rankin, Shuvon B, NP      . zolpidem (AMBIEN) tablet 5 mg  5 mg Oral QHS PRN Rankin, Shuvon B, NP       Lab Results: No results found for this or any previous visit (from the past 48 hour(s)).  Blood Alcohol level:  Lab Results  Component Value Date   ETH <5 11/30/2016   Metabolic Disorder Labs: No results found for: HGBA1C, MPG No results found for: PROLACTIN No results found for: CHOL, TRIG, HDL, CHOLHDL, VLDL, LDLCALC  Physical Findings: AIMS: Facial and Oral Movements Muscles of Facial Expression: None, normal Lips and Perioral Area: None, normal Jaw: None, normal Tongue: None, normal,Extremity Movements Upper (arms, wrists, hands, fingers): None, normal Lower (legs, knees, ankles, toes): None, normal, Trunk Movements Neck, shoulders, hips: None, normal, Overall Severity Severity of abnormal movements (highest score from questions above): None, normal Incapacitation due to abnormal movements: None, normal Patient's awareness of abnormal movements (rate only patient's report): No Awareness, Dental Status Current problems with teeth and/or dentures?: No Does patient usually wear dentures?: No  CIWA:  CIWA-Ar Total: 0 COWS:     Musculoskeletal: Strength & Muscle Tone: within normal limits Gait & Station: normal Patient leans: N/A  Psychiatric Specialty Exam: Physical Exam  Constitutional: He is oriented to person, place, and time. He  appears well-developed and well-nourished.  HENT:  Head: Normocephalic and atraumatic.  Respiratory: Effort normal.  Neurological: He is alert and oriented to person, place, and time.  Psychiatric:  As above    ROS  Blood pressure 118/74, pulse 74, temperature 97.7 F (36.5 C), temperature source Oral, resp. rate 16, height  (1.702 m), weight 70.8 kg (156 lb).Body mass index is 24.43 kg/m.  General Appearance:  Casually dressed, calm and cooperative. Good relatedness. Not internally distracted.   Eye Contact:  Good  Speech:  Spontaneous, normal rate and tone.   Volume:  Normal  Mood:  Euthymic  Affect:  Restricted  Thought Process:  Linear and goal directed.   Orientation:  WNL   Thought Content:  No delusional theme. No preoccupation  with violent thoughts. No hallucination in any modality.   Suicidal Thoughts:  Denies any thoughts of suicide.   Homicidal Thoughts:  No  Memory:  WNL  Judgement:  Better today.   Insight:  Minimizes his substance use. Not motivated to change his behavior.   Psychomotor Activity:  Normal   Concentration:  WNL  Recall:  Good   Fund of Knowledge:  Good  Language:  Good  Akathisia:  Negative  Handed:    AIMS (if indicated):     Assets:  Physical Health  ADL's:  Good  Cognition:  WNL  Sleep:  Number of Hours: 6   Treatment Plan Summary: Patient is coming off psychoactive stimulants. He is more in touch with reality today, still says he is not sure if he will stop using Meth. We plan to evaluate him further and gather collateral from his friend. Hopeful discharge in a day or two. He uses a Hispanic interpreter to communicate.  Psychiatric: Psychotic disorder  Medical:  Psychosocial:  Limited support  PLAN: -Continue the prn medications for psychosis, anxiety, insomnia Nausea. 1. Continue to monitor mood, behavior and interaction with peers  2. Continue to encourage unit groups and activities 3. SW would gather collateral from  his friend.   Armandina Stammer I, NP 12/04/2016, 3:58 PMPatient ID: Durel Salts, male   DOB: 11-03-1980, 36 y.o.   MRN: [id number]

## 2016-12-04 NOTE — Progress Notes (Signed)
Recreation Therapy Notes  Animal-Assisted Activity (AAA) Program Checklist/Progress Notes Patient Eligibility Criteria Checklist & Daily Group note for Rec TxIntervention  Date: 09.18.2018 Time: 2:45pm Location: [address] Dayroom   AAA/T Program Assumption of Risk Form signed by Patient/ or Parent Legal Guardian Yes  Patient is free of allergies or sever asthma Yes  Patient reports no fear of animals Yes  Patient reports no history of cruelty to animals Yes  Patient understands his/her participation is voluntary Yes  Patient washes hands before animal contact Yes  Patient washes hands after animal contact Yes  Behavioral Response: Appropriate, Engaged   Education:Hand Washing, Appropriate Animal Interaction   Education Outcome: Acknowledges education.   Clinical Observations/Feedback: Patient attended session and interacted appropriately with therapy dog and peers.   Yohance Hathorne L Jovan Colligan, LRT/CTRS        Shekina Cordell L 12/04/2016 2:58 PM 

## 2016-12-04 NOTE — Progress Notes (Signed)
D   Pt is visible on the milieu   He has limited interaction with others due to the language barrier and his intrepetor did not return tonight    He has no complaints and is cooperative with treatment  He has a pleasant expression on his face and waves to staff or nods his head and speaks in spanish A   Verbal support given    Medications offered    Q 15 min checks R   Pt is safe at present

## 2016-12-05 MED ORDER — BACITRACIN ZINC 500 UNIT/GM EX OINT: 1.0000 "application " | TOPICAL_OINTMENT | Freq: Two times a day (BID) | CUTANEOUS | 0 refills | Status: DC

## 2016-12-05 NOTE — Discharge Summary (Signed)
Physician Discharge Summary Note  Patient:  [name] is an 36 y.o., male MRN:  [id number] DOB:  [birth year] Patient phone:  [phone] (home)  Patient address:   [address] [place] [address],  Total Time spent with patient: 30 minutes  Date of Admission:  11/30/2016 Date of Discharge: 12/05/2016  Reason for Admission: Per HPI- 36 y.o Hispanic male, single, works in [employer]. Presented via emergency services. Flagged down the police and requested for help. Self inflicted wound on his left chest. 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 UDS positive for amphetamine. No alcohol. No critical labs.  At the ER, patient was very guarded. Did not want to disclose how he sustained the injury to his chest. Says he wants to be deported back to [place]. Patient was involuntarily committed at the ER. I interviewed him via an interpreter. Patient was very guarded and did not want to talk. Says he has told his story and does not want any further questioning. When probed about feeling of persecution, he responded that he is scared of anything. Repeatedly states that he wants to be taken to jail and then deported back to [place]. When probed about stressors, patient was mute. Remained mute when probed about substance use. When probed about how he sustained the chest wound, patient remained mute.   Principal Problem: Psychotic disorder Discharge Diagnoses: Patient Active Problem List   Diagnosis Date Noted  . Psychotic disorder [F29] 11/30/2016    Past Psychiatric History:   Past Medical History: History reviewed. No pertinent past medical history.  Past Surgical History:  Procedure Laterality Date  . NO PAST SURGERIES    . ORIF ULNAR FRACTURE Left 01/28/2013   Procedure:  LEFT ULNAR OPEN REDUCTION INTERNAL FIXATION (ORIF) ;  Surgeon: Sharma Covert, MD;  Location: [facility] OR;  Service: Orthopedics;  Laterality: Left;   Family History: History reviewed. No pertinent family  history. Family Psychiatric  History:  Social History:  History  Alcohol Use  . Yes    Comment: rare     History  Drug Use No    Social History   Social History  . Marital status: Significant Other    Spouse name: N/A  . Number of children: N/A  . Years of education: N/A   Social History Main Topics  . Smoking status: Never Smoker  . Smokeless tobacco: Never Used  . Alcohol use Yes     Comment: rare  . Drug use: No  . Sexual activity: Not Asked   Other Topics Concern  . None   Social History Narrative  . None    Hospital Course:  [name] was admitted for Psychotic disorder  and crisis management.  Pt was treated discharged with the medications listed below under Medication List.  Medical problems were identified and treated as needed.  Home medications were restarted as appropriate.  Improvement was monitored by observation and [name] 's daily report of symptom reduction.  Emotional and mental status was monitored by daily self-inventory reports completed by [name] and clinical staff.         [name] was evaluated by the treatment team for stability and plans for continued recovery upon discharge. [name] 's motivation was an integral factor for scheduling further treatment. Employment, transportation, bed availability, health status, family support, and any pending legal issues were also considered during hospital stay. Pt was offered further treatment options upon discharge including but not limited to Residential, Intensive Outpatient, and Outpatient treatment.  [name]  will follow up with the services as listed below under Follow Up Information.     Upon completion of this admission the patient was both mentally and medically stable for discharge denying suicidal/homicidal ideation, auditory/visual/tactile hallucinations, delusional thoughts and paranoia.     [name] responded well to treatment with Individual and Group session. . Pt demonstrated improvement without reported or observed adverse effects to the point of stability appropriate for outpatient management. Pertinent labs include: CMP, CBC  for which outpatient follow-up is necessary for lab recheck as mentioned below. Reviewed CBC, CMP, BAL, and UDS + Amphetamines ; all unremarkable aside from noted exceptions.   Physical Findings: AIMS: Facial and Oral Movements Muscles of Facial Expression: None, normal Lips and Perioral Area: None, normal Jaw: None, normal Tongue: None, normal,Extremity Movements Upper (arms, wrists, hands, fingers): None, normal Lower (legs, knees, ankles, toes): None, normal, Trunk Movements Neck, shoulders, hips: None, normal, Overall Severity Severity of abnormal movements (highest score from questions above): None, normal Incapacitation due to abnormal movements: None, normal Patient's awareness of abnormal movements (rate only patient's report): No Awareness, Dental Status Current problems with teeth and/or dentures?: No Does patient usually wear dentures?: No  CIWA:  CIWA-Ar Total: 0 COWS:     Musculoskeletal: Strength & Muscle Tone: within normal limits Gait & Station: normal Patient leans: N/A  Psychiatric Specialty Exam: See SRA by MD Physical Exam  Vitals reviewed. Cardiovascular: Normal rate.   Neurological: He is alert.  Psychiatric: He has a normal mood and affect.    Review of Systems  Psychiatric/Behavioral: Negative for depression (stable), substance abuse and suicidal ideas.    Blood pressure 103/75, pulse 85, temperature 97.9 F (36.6 C), temperature source Oral, resp. rate 18, height  (1.702 m), weight 70.8 kg (156 lb).Body mass index is 24.43 kg/m.    Have you used any form of tobacco in the last 30 days? (Cigarettes, Smokeless Tobacco, Cigars, and/or Pipes): No  Has this patient used any form of tobacco  in the last 30 days? (Cigarettes, Smokeless Tobacco, Cigars, and/or Pipes)  No  Blood Alcohol level:  Lab Results  Component Value Date   ETH <5 11/30/2016    Metabolic Disorder Labs:  No results found for: HGBA1C, MPG No results found for: PROLACTIN No results found for: CHOL, TRIG, HDL, CHOLHDL, VLDL, LDLCALC  See Psychiatric Specialty Exam and Suicide Risk Assessment completed by Attending Physician prior to discharge.  Discharge destination:  Home  Is patient on multiple antipsychotic therapies at discharge:  No   Has Patient had three or more failed trials of antipsychotic monotherapy by history:  No  Recommended Plan for Multiple Antipsychotic Therapies: NA  Discharge Instructions    Diet - low sodium heart healthy    Complete by:  As directed    Discharge instructions    Complete by:  As directed    Take all medications as prescribed. Keep all follow-up appointments as scheduled.  Do not consume alcohol or use illegal drugs while on prescription medications. Report any adverse effects from your medications to your primary care provider promptly.  In the event of recurrent symptoms or worsening symptoms, call 911, a crisis hotline, or go to the nearest emergency department for evaluation.   Increase activity slowly    Complete by:  As directed      Allergies as of 12/05/2016   No Known Allergies     Medication List    STOP taking these medications   ciprofloxacin 500 MG tablet  Commonly known as:  CIPRO   HYDROcodone-acetaminophen 5-325 MG tablet Commonly known as:  NORCO/VICODIN   metroNIDAZOLE 500 MG tablet Commonly known as:  FLAGYL   ondansetron 4 MG tablet Commonly known as:  ZOFRAN     TAKE these medications     Indication  bacitracin ointment Apply 1 application topically 2 (two) times daily. Abrasion  Indication:  abrasion site        Follow-up recommendations:  Activity:  as tolerated Diet:  heart healthy  Comments:  Take all  medications as prescribed. Keep all follow-up appointments as scheduled.  Do not consume alcohol or use illegal drugs while on prescription medications. Report any adverse effects from your medications to your primary care provider promptly.  In the event of recurrent symptoms or worsening symptoms, call 911, a crisis hotline, or go to the nearest emergency department for evaluation.   Signed: Oneta Rack, NP 12/05/2016, 9:54 AM  Patient seen, Suicide Assessment Completed.  Disposition Plan Reviewed

## 2016-12-05 NOTE — BHH Suicide Risk Assessment (Signed)
[facility] Discharge Suicide Risk Assessment ( patient interviewed in Spanish)   Principal Problem: Psychotic disorder Discharge Diagnoses:  Patient Active Problem List   Diagnosis Date Noted  . Psychotic disorder [F29] 11/30/2016    Total Time spent with patient: 30 minutes  Musculoskeletal: Strength & Muscle Tone: within normal limits Gait & Station: normal Patient leans: N/A  Psychiatric Specialty Exam: ROS denies headache, denies chest pain, denies shortness of breath, no vomiting, no rash, no fever   Blood pressure 103/75, pulse 85, temperature 97.9 F (36.6 C), temperature source Oral, resp. rate 18, height  (1.702 m), weight 70.8 kg (156 lb).Body mass index is 24.43 kg/m.  General Appearance: Well Groomed  Eye Contact::  Good  Speech:  Normal Rate409  Volume:  Normal  Mood:  denies depression, states " my mood is fine ", presents euthymic  Affect:  Appropriate and Full Range  Thought Process:  Linear and Descriptions of Associations: Intact  Orientation:  Full (Time, Place, and Person)  Thought Content:  denies hallucinations, no delusions, not internally preoccupied , does not express any paranoid or persecutory ideations   Suicidal Thoughts:  No denies any suicidal or self injurious ideations, denies any homicidal or violent ideations  Homicidal Thoughts:  No  Memory:  recent and remote grossly intact   Judgement:  Other:  fair- improving   Insight:  fair- improving   Psychomotor Activity:  Normal  Concentration:  Good  Recall:  Good  Fund of Knowledge:Good  Language: Good  Akathisia:  Negative  Handed:  Right  AIMS (if indicated):     Assets:  Desire for Improvement Resilience  Sleep:  Number of Hours: 7  Cognition: WNL  ADL's:  Intact   Mental Status Per Nursing Assessment::   On Admission:  Suicidal ideation indicated by others, Self-harm behaviors  Demographic Factors:  36 year old male   Loss Factors: Language barrier, drug abuse   Historical  Factors: methamphetamine abuse   Risk Reduction Factors:   Sense of responsibility to family, Employed and Positive coping skills or problem solving skills Reports a strong sense of duty to his mother, and states she is financially dependent on him  Continued Clinical Symptoms:  At this time patient is alert, attentive, well related, calm. Mood is described as "OK", denies depression, affect is appropriate and reactive, no thought disorder, no suicidal or self injurious ideations , denies any violent or homicidal ideations He denies hallucinations, and does not appear internally preoccupied . He does not express delusions or paranoid ideations today. He states he is aware methamphetamine use contributed to paranoia ( he also states that he grew up in a community/culture  that believed in " santeria" so that there is an element of culturally bound experience )  He is future oriented , states he plans to return to work tomorrow- he states he needs to send some money to his mother soon. He states once he saves enough money he may decide to return to [place] to live with his family/mother.  Behavior on unit calm and in good control.   Cognitive Features That Contribute To Risk:  No gross cognitive deficits noted upon discharge. Is alert , attentive, and oriented x 3   Suicide Risk:  Mild:  Suicidal ideation of limited frequency, intensity, duration, and specificity.  There are no identifiable plans, no associated intent, mild dysphoria and related symptoms, good self-control (both objective and subjective assessment), few other risk factors, and identifiable protective factors, including available and  accessible social support.    Plan Of Care/Follow-up recommendations:  Activity:  as tolerated  Diet:  Regular  Tests:  NA Other:  see below Patient is requesting discharge, expressing readiness for discharge, and there are no current grounds for involuntary commitment. Plans to return home. He is  leaving unit in good spirits . I have emphasized dangers associated with drug abuse/methamphetamine abuse, and have encouraged him to completely abstain from illicit drugs  Craige Cotta, MD 12/05/2016, 9:36 AM

## 2016-12-05 NOTE — Progress Notes (Signed)
Recreation Therapy Notes  Date: 12/05/16 Time: 0930 Location: [address] Dayroom  Group Topic: Stress Management  Goal Area(s) Addresses:  Patient will verbalize importance of using healthy stress management.  Patient will identify positive emotions associated with healthy stress management.   Behavioral Response: Engaged  Intervention: Stress Management  Activity :  Progressive Muscle Relaxation.  LRT introduced the stress management technique of progressive muscle relaxation.  LRT read a script that instructed patients to tense then relax each muscle group individually.  Patients were to follow along as the script was read to fully engage in the practice.  Education:  Stress Management, Discharge Planning.   Education Outcome: Acknowledges edcuation/In group clarification offered/Needs additional education  Clinical Observations/Feedback: Pt attended group.    Naja Apperson, LRT/CTRS         Brylee Mcgreal A 12/05/2016 12:11 PM 

## 2016-12-05 NOTE — Tx Team (Signed)
Interdisciplinary Treatment and Diagnostic Plan Update  12/05/2016 Time of Session: 0830 [name] MRN: [id number]  Principal Diagnosis: Psychotic disorder  Secondary Diagnoses: Principal Problem:   Psychotic disorder   Current Medications:  Current Facility-Administered Medications  Medication Dose Route Frequency Provider Last Rate Last Dose  . acetaminophen (TYLENOL) tablet 650 mg  650 mg Oral Q6H PRN Rankin, Shuvon B, NP      . bacitracin ointment 1 application  1 application Topical BID Rankin, Shuvon B, NP   1 application at 12/04/16 1700  . diphenhydrAMINE (BENADRYL) capsule 50 mg  50 mg Oral Q6H PRN Izediuno, Delight Ovens, MD       Or  . diphenhydrAMINE (BENADRYL) injection 50 mg  50 mg Intramuscular Q6H PRN Izediuno, Vincent A, MD      . feeding supplement (ENSURE ENLIVE) (ENSURE ENLIVE) liquid 237 mL  237 mL Oral BID BM Cobos, Fernando A, MD   237 mL at 12/05/16 0829  . haloperidol (HALDOL) tablet 5 mg  5 mg Oral Q6H PRN Izediuno, Delight Ovens, MD       Or  . haloperidol lactate (HALDOL) injection 5 mg  5 mg Intramuscular Q6H PRN Izediuno, Vincent A, MD      . LORazepam (ATIVAN) tablet 1 mg  1 mg Oral Q6H PRN Izediuno, Delight Ovens, MD       Or  . LORazepam (ATIVAN) injection 1 mg  1 mg Intramuscular Q6H PRN Izediuno, Vincent A, MD      . magnesium hydroxide (MILK OF MAGNESIA) suspension 30 mL  30 mL Oral Daily PRN Rankin, Shuvon B, NP      . ondansetron (ZOFRAN) tablet 4 mg  4 mg Oral Q8H PRN Rankin, Shuvon B, NP      . zolpidem (AMBIEN) tablet 5 mg  5 mg Oral QHS PRN Rankin, Shuvon B, NP       PTA Medications: Prescriptions Prior to Admission  Medication Sig Dispense Refill Last Dose  . ciprofloxacin (CIPRO) 500 MG tablet Take 1 tablet (500 mg total) by mouth 2 (two) times daily. 28 tablet 0   . HYDROcodone-acetaminophen (NORCO/VICODIN) 5-325 MG tablet Take 1-2 tablets by mouth every 4 (four) hours as needed. 20 tablet 0   . metroNIDAZOLE (FLAGYL) 500 MG tablet  Take 1 tablet (500 mg total) by mouth 2 (two) times daily. 28 tablet 0   . ondansetron (ZOFRAN) 4 MG tablet Take 1 tablet (4 mg total) by mouth every 6 (six) hours. 12 tablet 0     Patient Stressors: Financial difficulties Other: Pt wishes to return to [place]  Patient Strengths: Average or above average intelligence Physical Health  Treatment Modalities: Medication Management, Group therapy, Case management,  1 to 1 session with clinician, Psychoeducation, Recreational therapy.   Physician Treatment Plan for Primary Diagnosis: Psychotic disorder Long Term Goal(s): Improvement in symptoms so as ready for discharge Improvement in symptoms so as ready for discharge   Short Term Goals: Ability to identify changes in lifestyle to reduce recurrence of condition will improve Ability to verbalize feelings will improve Ability to disclose and discuss suicidal ideas Ability to demonstrate self-control will improve Ability to identify and develop effective coping behaviors will improve Ability to maintain clinical measurements within normal limits will improve Compliance with prescribed medications will improve Ability to identify triggers associated with substance abuse/mental health issues will improve Ability to identify changes in lifestyle to reduce recurrence of condition will improve Ability to verbalize feelings will improve Ability to disclose and discuss suicidal  ideas Ability to demonstrate self-control will improve Ability to identify and develop effective coping behaviors will improve Ability to maintain clinical measurements within normal limits will improve Compliance with prescribed medications will improve Ability to identify triggers associated with substance abuse/mental health issues will improve  Medication Management: Evaluate patient's response, side effects, and tolerance of medication regimen.  Therapeutic Interventions: 1 to 1 sessions, Unit Group sessions and  Medication administration.  Evaluation of Outcomes: Adequate for Discharge  Physician Treatment Plan for Secondary Diagnosis: Principal Problem:   Psychotic disorder  Long Term Goal(s): Improvement in symptoms so as ready for discharge Improvement in symptoms so as ready for discharge   Short Term Goals: Ability to identify changes in lifestyle to reduce recurrence of condition will improve Ability to verbalize feelings will improve Ability to disclose and discuss suicidal ideas Ability to demonstrate self-control will improve Ability to identify and develop effective coping behaviors will improve Ability to maintain clinical measurements within normal limits will improve Compliance with prescribed medications will improve Ability to identify triggers associated with substance abuse/mental health issues will improve Ability to identify changes in lifestyle to reduce recurrence of condition will improve Ability to verbalize feelings will improve Ability to disclose and discuss suicidal ideas Ability to demonstrate self-control will improve Ability to identify and develop effective coping behaviors will improve Ability to maintain clinical measurements within normal limits will improve Compliance with prescribed medications will improve Ability to identify triggers associated with substance abuse/mental health issues will improve     Medication Management: Evaluate patient's response, side effects, and tolerance of medication regimen.  Therapeutic Interventions: 1 to 1 sessions, Unit Group sessions and Medication administration.  Evaluation of Outcomes: Adequate for Discharge   RN Treatment Plan for Primary Diagnosis: Psychotic disorder Long Term Goal(s): Knowledge of disease and therapeutic regimen to maintain health will improve  Short Term Goals: Ability to demonstrate self-control, Ability to participate in decision making will improve and Ability to verbalize feelings will  improve  Medication Management: RN will administer medications as ordered by provider, will assess and evaluate patient's response and provide education to patient for prescribed medication. RN will report any adverse and/or side effects to prescribing provider.  Therapeutic Interventions: 1 on 1 counseling sessions, Psychoeducation, Medication administration, Evaluate responses to treatment, Monitor vital signs and CBGs as ordered, Perform/monitor CIWA, COWS, AIMS and Fall Risk screenings as ordered, Perform wound care treatments as ordered.  Evaluation of Outcomes: Adequate for Discharge   LCSW Treatment Plan for Primary Diagnosis: Psychotic disorder Long Term Goal(s): Safe transition to appropriate next level of care at discharge, Engage patient in therapeutic group addressing interpersonal concerns.  Short Term Goals: Engage patient in aftercare planning with referrals and resources, Facilitate patient progression through stages of change regarding substance use diagnoses and concerns and Identify triggers associated with mental health/substance abuse issues  Therapeutic Interventions: Assess for all discharge needs, 1 to 1 time with Social worker, Explore available resources and support systems, Assess for adequacy in community support network, Educate family and significant other(s) on suicide prevention, Complete Psychosocial Assessment, Interpersonal group therapy.  Evaluation of Outcomes: Adequate for Discharge   Progress in Treatment: Attending groups: Yes. Participating in groups: Yes. Taking medication as prescribed: Yes. Toleration medication: Yes. Family/Significant other contact made: SPE completed with pt; pt declined to consent to family contact.  Patient understands diagnosis: Yes. Discussing patient identified problems/goals with staff: Yes. Medical problems stabilized or resolved: Yes. Denies suicidal/homicidal ideation: Yes. Issues/concerns per patient  self-inventory: No.  Other: n/a   New problem(s) identified: No, Describe:  n/a  New Short Term/Long Term Goal(s): medication management for mood stabilization; detox; elimination of SI thoughts; development of comprehensive mental wellness/sobriety plan.  Patient Goal: "To go home. I feel better."   Discharge Plan or Barriers: Pt declined all services. He was provided with [facility] [place] information if he changes his mind; AA list for [employer]. Pt plans to return home and resume work with the long term plan to move back to [place].   Reason for Continuation of Hospitalization: none  Estimated Length of Stay: today 12/05/16  Attendees: Patient: 12/05/2016 10:40 AM  Physician: Dr. Elna Breslow MD; Dr. Jama Flavors MD 12/05/2016 10:40 AM  Nursing: Foy Guadalajara RN; Patrice RN 12/05/2016 10:40 AM  RN Care Manager: Onnie Boer CM 12/05/2016 10:40 AM  Social Worker: Chartered loss adjuster, LCSW; Donnelly Stager LCSWA 12/05/2016 10:40 AM  Recreational Therapist: x 12/05/2016 10:40 AM  Other: Reola Calkins NP; Armandina Stammer NP 12/05/2016 10:40 AM  Other:  12/05/2016 10:40 AM  Other: 12/05/2016 10:40 AM    Scribe for Treatment Team: Ledell Peoples Smart, LCSW 12/05/2016 10:40 AM

## 2016-12-05 NOTE — Progress Notes (Signed)
  [facility] Adult Case Management Discharge Plan :  Will you be returning to the same living situation after discharge:  Yes,  home At discharge, do you have transportation home?: Yes,  friend Do you have the ability to pay for your medications: Yes,  mental health  Release of information consent forms completed and submitted to medical records by CSW.  Patient to Follow up at: Follow-up Information    Patient declined all follow-up. Follow up.           Next level of care provider has access to [facility] Link:no  Safety Planning and Suicide Prevention discussed: Yes,  SPE completed with pt; pt declined to consent to family contact.  Have you used any form of tobacco in the last 30 days? (Cigarettes, Smokeless Tobacco, Cigars, and/or Pipes): No  Has patient been referred to the Quitline?: Patient refused referral  Patient has been referred for addiction treatment: Pt. refused referral  [employer], LCSW 12/05/2016, 10:39 AM

## 2016-12-05 NOTE — [facility] Suicide Risk Assessment (Signed)
[facility] INPATIENT:  Family/Significant Other Suicide Prevention Education  Suicide Prevention Education:  Patient Refusal for Family/Significant Other Suicide Prevention Education: The patient [name] has refused to provide written consent for family/significant other to be provided Family/Significant Other Suicide Prevention Education during admission and/or prior to discharge.  Physician notified.  SPE completed with pt, as pt refused to consent to family contact. SPI pamphlet provided to pt and pt was encouraged to share information with support network, ask questions, and talk about any concerns relating to SPE. Pt denies access to guns/firearms and verbalized understanding of information provided. Mobile Crisis information also provided to pt.   Seth Kuchenbecker N Smart LCSW 12/05/2016, 10:39 AM

## 2016-12-05 NOTE — Progress Notes (Signed)
Patient ID: [name], male   DOB: 05-15-1980, 36 y.o.   MRN: [id number]  Discharge Note: Belongings returned to patient at time of discharge. Patient denies any pain or discomfort. Discharge instructions and medications were reviewed with patient with interpreter present for translation. Patient verbalized understanding of both medications and discharge instructions. Q15 minute safety checks maintained until time of discharge. No distress noted upon discharge. Patient's ride was waiting in lobby when patient was discharged.

## 2016-12-05 NOTE — Progress Notes (Signed)
Patient ID: [name], male   DOB: 19-May-1980, 36 y.o.   MRN: [id number]  DAR: Pt. Denies SI/HI and A/V Hallucinations. He reports sleep was good, appetite is good, energy level is normal, and concentration is good. Patient does not report any pain or discomfort at this time. Support and encouragement provided to the patient. Scheduled Bacitracin refused however did accept his scheduled Ensure. He does not require PRN medications at this time. Patient is cooperative. He is seen in the milieu and is interacting with his interpreter appropriately. He reports that he is ready for discharge. Interpreter is utilized by Press photographer to communicate with patient. Q15 minute checks are maintained for safety.

## 2016-12-22 ENCOUNTER — Encounter (HOSPITAL_COMMUNITY): Payer: Self-pay | Admitting: *Deleted

## 2016-12-22 ENCOUNTER — Emergency Department (HOSPITAL_COMMUNITY)
Admission: EM | Admit: 2016-12-22 | Discharge: 2016-12-24 | Disposition: A | Discharge status: Home / Self Care | Payer: Self-pay | Attending: Emergency Medicine | Admitting: Emergency Medicine

## 2016-12-22 DIAGNOSIS — F19922 Other psychoactive substance use, unspecified with intoxication with perceptual disturbance: Secondary | ICD-10-CM | POA: Diagnosis present

## 2016-12-22 DIAGNOSIS — F23 Brief psychotic disorder: Secondary | ICD-10-CM | POA: Insufficient documentation

## 2016-12-22 DIAGNOSIS — F15229 Other stimulant dependence with intoxication, unspecified: Secondary | ICD-10-CM

## 2016-12-22 DIAGNOSIS — F19950 Other psychoactive substance use, unspecified with psychoactive substance-induced psychotic disorder with delusions: Secondary | ICD-10-CM

## 2016-12-22 LAB — CBC WITH DIFFERENTIAL/PLATELET
BASOS ABS: 0 10*3/uL (ref 0.0–0.1)
Basophils Relative: 0 %
EOS ABS: 0.2 10*3/uL (ref 0.0–0.7)
EOS PCT: 5 %
HEMATOCRIT: 36.8 % — AB (ref 39.0–52.0)
Hemoglobin: 12.9 g/dL — ABNORMAL LOW (ref 13.0–17.0)
LYMPHS ABS: 1.3 10*3/uL (ref 0.7–4.0)
Lymphocytes Relative: 27 %
MCH: 30.4 pg (ref 26.0–34.0)
MCHC: 35.1 g/dL (ref 30.0–36.0)
MCV: 86.6 fL (ref 78.0–100.0)
MONO ABS: 0.3 10*3/uL (ref 0.1–1.0)
Monocytes Relative: 7 %
Neutro Abs: 2.9 10*3/uL (ref 1.7–7.7)
Neutrophils Relative %: 61 %
Platelets: 356 10*3/uL (ref 150–400)
RBC: 4.25 MIL/uL (ref 4.22–5.81)
RDW: 12.8 % (ref 11.5–15.5)
WBC: 4.8 10*3/uL (ref 4.0–10.5)

## 2016-12-22 LAB — COMPREHENSIVE METABOLIC PANEL
ALK PHOS: 71 U/L (ref 38–126)
ALT: 16 U/L — ABNORMAL LOW (ref 17–63)
ANION GAP: 14 (ref 5–15)
AST: 20 U/L (ref 15–41)
Albumin: 4.4 g/dL (ref 3.5–5.0)
BILIRUBIN TOTAL: 0.5 mg/dL (ref 0.3–1.2)
BUN: 8 mg/dL (ref 6–20)
CALCIUM: 9.2 mg/dL (ref 8.9–10.3)
CO2: 21 mmol/L — ABNORMAL LOW (ref 22–32)
Chloride: 102 mmol/L (ref 101–111)
Creatinine, Ser: 0.79 mg/dL (ref 0.61–1.24)
GFR calc Af Amer: >60 mL/min (ref >60)
Glucose, Bld: 121 mg/dL — ABNORMAL HIGH (ref 65–99)
POTASSIUM: 3.2 mmol/L — AB (ref 3.5–5.1)
Sodium: 137 mmol/L (ref 135–145)
TOTAL PROTEIN: 7.9 g/dL (ref 6.5–8.1)

## 2016-12-22 LAB — CBG MONITORING, ED: Glucose-Capillary: 121 mg/dL — ABNORMAL HIGH (ref 65–99)

## 2016-12-22 LAB — ACETAMINOPHEN LEVEL: Acetaminophen (Tylenol), Serum: <10 ug/mL — ABNORMAL LOW (ref 10–30)

## 2016-12-22 LAB — SALICYLATE LEVEL

## 2016-12-22 LAB — ETHANOL

## 2016-12-22 MED ORDER — ONDANSETRON HCL 4 MG PO TABS: 4.0000 mg | ORAL_TABLET | Freq: Three times a day (TID) | ORAL | Status: DC | PRN

## 2016-12-22 MED ORDER — IBUPROFEN 200 MG PO TABS: 600.0000 mg | ORAL_TABLET | Freq: Three times a day (TID) | ORAL | Status: DC | PRN

## 2016-12-22 MED ORDER — ACETAMINOPHEN 500 MG PO TABS: 500.0000 mg | ORAL_TABLET | Freq: Four times a day (QID) | ORAL | Status: DC | PRN

## 2016-12-22 MED ORDER — LORAZEPAM 1 MG PO TABS
1.0000 mg | ORAL_TABLET | ORAL | Status: DC | PRN
  Filled 2016-12-22: qty 1

## 2016-12-22 MED ORDER — ZIPRASIDONE MESYLATE 20 MG IM SOLR
20.0000 mg | Freq: Once | INTRAMUSCULAR | Status: AC
  Administered 2016-12-22: 20 mg via INTRAMUSCULAR

## 2016-12-22 NOTE — ED Notes (Signed)
Bed: WA17 Expected date:  Expected time:  Means of arrival:  Comments: Hallucinations, IVC

## 2016-12-22 NOTE — ED Notes (Signed)
Pt's belongings in cabinet by nurses station  

## 2016-12-22 NOTE — ED Notes (Signed)
Pt resting at present time, sitter preset and will assist pt in changing into scrubs.
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
## 2016-12-22 NOTE — ED Notes (Signed)
Bed: [facility] Expected date:  Expected time:  Means of arrival:  Comments: WA 17

## 2016-12-22 NOTE — BH Assessment (Signed)
[facility] Assessment Progress Note  TTS went in to assess pt. Pt is currently sleeping an unable to be aroused. Sitter notified pt will be assessed once he is alert and able to engage with Clinical research associate.  Princess Bruins, MSW, LCSW Therapeutic Triage Specialist  [phone]
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
## 2016-12-23 NOTE — ED Notes (Addendum)
SBAR Report received from previous nurse. Pt received calm and visible on unit. Pt denies current SI/ HI, A/V H, depression, anxiety, or pain at this time, and appears otherwise stable and free of distress, but does not speak english so questions and answers were limited to single word answers.. Pt reminded of camera surveillance, q 15 min rounds, and rules of the milieu. Pt provided cup for urin collection, will continue to assess.

## 2016-12-23 NOTE — ED Notes (Signed)
SBAR Report received from previous nurse. Pt received asleep on unit and not compliant with psych assessment but appears otherwise stable and free of distress. Pt reminded of camera surveillance, q 15 min rounds, and rules of the milieu. Will continue to assess.

## 2016-12-23 NOTE — ED Notes (Signed)
Patient became upset after visitor exited the unit.  He attempted to pick up the room chair and was banging the walls. Security and [employer] to the room for boundary setting.  PRN Ativan offered but refused.  Calm at present and reassurance given per iphone interpeter.

## 2016-12-23 NOTE — ED Notes (Signed)
Language barrier during all assessments. Attempted to use ipad interpetation services which was unsuccesful.

## 2016-12-23 NOTE — BH Assessment (Signed)
BHH Assessment Progress Note   Per Nira Conn, NP pt is to observed overnight and re-evaluated by psych in AM due to safety measures and level of intoxication on arrival to ED according to [facility] docs. EDP and [name], RN aware of recommendation.  Princess Bruins, MSW, LCSW Therapeutic Triage Specialist  [phone]

## 2016-12-24 DIAGNOSIS — F19922 Other psychoactive substance use, unspecified with intoxication with perceptual disturbance: Secondary | ICD-10-CM

## 2016-12-24 DIAGNOSIS — R443 Hallucinations, unspecified: Secondary | ICD-10-CM

## 2016-12-24 DIAGNOSIS — F23 Brief psychotic disorder: Secondary | ICD-10-CM

## 2016-12-24 DIAGNOSIS — R451 Restlessness and agitation: Secondary | ICD-10-CM

## 2016-12-24 DIAGNOSIS — F19950 Other psychoactive substance use, unspecified with psychoactive substance-induced psychotic disorder with delusions: Secondary | ICD-10-CM

## 2016-12-24 DIAGNOSIS — F15229 Other stimulant dependence with intoxication, unspecified: Secondary | ICD-10-CM | POA: Diagnosis present

## 2016-12-24 MED ORDER — GABAPENTIN 300 MG PO CAPS
300.0000 mg | ORAL_CAPSULE | Freq: Two times a day (BID) | ORAL | Status: DC
  Filled 2016-12-24: qty 1

## 2016-12-24 MED ORDER — GABAPENTIN 300 MG PO CAPS: 300.0000 mg | ORAL_CAPSULE | Freq: Two times a day (BID) | ORAL | 0 refills | Status: DC

## 2016-12-24 NOTE — Discharge Instructions (Signed)
To help you maintain a sober lifestyle, a substance abuse treatment program may be beneficial to you.  Contact [facility] at your earliest opportunity to ask about enrolling in their program: ° °     [facility] ([facility]) °     [address] °     [place],  [address] °     [phone] °     New patients are seen at the walk-in clinic every Tuesday from 9:00 am - 12:00 pm °

## 2016-12-24 NOTE — Consult Note (Signed)
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 male patient does not warrant admission.  HPI:  36 yo male who presented to the ED after using crystal meth and having hallucinations with agitation.  Today, he is calm but irritable at times, refusing to continue talking to the interpreter on the computer, [place].  He speaks [place] but had wanted a [place] interpreter.  No suicidal/homicidal ideations, hallucinations, or withdrawal symptoms noted.  Not interested in Peer Support when they went to talk with him.  STable for discharge.  Past Psychiatric History: substance abuse, depression  Risk to Self: None Risk to Others: Homicidal Ideation: No Thoughts of Harm to Others: No Current Homicidal Intent: No Current Homicidal Plan: No Access to Homicidal Means: No History of harm to others?: No Assessment of Violence: None Noted Does patient have access to weapons?: No Criminal Charges Pending?: No Does patient have a court date: No Prior Inpatient Therapy: Prior Inpatient Therapy: Yes Prior Therapy Dates: 2018 Prior Therapy Facilty/Provider(s): Psychosis Reason for Treatment: AVH Prior Outpatient Therapy: Prior Outpatient Therapy: No Does patient have an ACCT team?: No Does  patient have Intensive In-House Services?  : No Does patient have [facility] services? : No Does patient have P4CC services?: No  Past Medical History: History reviewed. No pertinent past medical history.  Past Surgical History:  Procedure Laterality Date  . NO PAST SURGERIES    . ORIF ULNAR FRACTURE Left 01/28/2013   Procedure:  LEFT ULNAR OPEN REDUCTION INTERNAL FIXATION (ORIF) ;  Surgeon: Linna Hoff, MD;  Location: [place];  Service: Orthopedics;  Laterality: Left;   Family History: No family history on file. Family Psychiatric  History: unknowon Social History:  History  Alcohol Use  . Yes    Comment: rare     History  Drug Use    Comment: unknown substance     Social History   Social History  . Marital status: Significant Other    Spouse name: N/A  . Number of children: N/A  . Years of education: N/A   Social History Main Topics  . Smoking status: Never Smoker  . Smokeless tobacco: Never Used  . Alcohol use Yes     Comment: rare  . Drug use: Yes     Comment: unknown substance   . Sexual activity: Not Asked   Other Topics Concern  . None   Social History Narrative  . None   Additional Social History:    Allergies:  No Known Allergies  Labs:  Results for orders placed or performed during the hospital encounter of 12/22/16 (from the past 48 hour(s))  Comprehensive metabolic panel     Status: Abnormal   Collection Time: 12/22/16  1:43 PM  Result Value Ref Range   Sodium 137 135 - 145 mmol/L   Potassium 3.2 (L) 3.5 - 5.1 mmol/L   Chloride 102 101 - 111  mmol/L   CO2 21 (L) 22 - 32 mmol/L   Glucose, Bld 121 (H) 65 - 99 mg/dL   BUN 8 6 - 20 mg/dL   Creatinine, Ser 0.79 0.61 - 1.24 mg/dL   Calcium 9.2 8.9 - 10.3 mg/dL   Total Protein 7.9 6.5 - 8.1 g/dL   Albumin 4.4 3.5 - 5.0 g/dL   AST 20 15 - 41 U/L   ALT 16 (L) 17 - 63 U/L   Alkaline Phosphatase 71 38 - 126 U/L   Total Bilirubin 0.5 0.3 - 1.2 mg/dL   GFR calc non Af Amer >60 >60 mL/min   GFR calc  Af Amer >60 >60 mL/min    Comment: (NOTE) The eGFR has been calculated using the CKD EPI equation. This calculation has not been validated in all clinical situations. eGFR's persistently <60 mL/min signify possible Chronic Kidney Disease.    Anion gap 14 5 - 15  Salicylate level     Status: None   Collection Time: 12/22/16  1:43 PM  Result Value Ref Range   Salicylate Lvl <6.2 2.8 - 30.0 mg/dL  Acetaminophen level     Status: Abnormal   Collection Time: 12/22/16  1:43 PM  Result Value Ref Range   Acetaminophen (Tylenol), Serum <10 (L) 10 - 30 ug/mL    Comment:        THERAPEUTIC CONCENTRATIONS VARY SIGNIFICANTLY. A RANGE OF 10-30 ug/mL MAY BE AN EFFECTIVE CONCENTRATION FOR MANY PATIENTS. HOWEVER, SOME ARE BEST TREATED AT CONCENTRATIONS OUTSIDE THIS RANGE. ACETAMINOPHEN CONCENTRATIONS >150 ug/mL AT 4 HOURS AFTER INGESTION AND >50 ug/mL AT 12 HOURS AFTER INGESTION ARE OFTEN ASSOCIATED WITH TOXIC REACTIONS.   Ethanol     Status: None   Collection Time: 12/22/16  1:43 PM  Result Value Ref Range   Alcohol, Ethyl (B) <10 <10 mg/dL    Comment:        LOWEST DETECTABLE LIMIT FOR SERUM ALCOHOL IS 10 mg/dL FOR MEDICAL PURPOSES ONLY Please note change in reference range.   CBC WITH DIFFERENTIAL     Status: Abnormal   Collection Time: 12/22/16  1:43 PM  Result Value Ref Range   WBC 4.8 4.0 - 10.5 K/uL   RBC 4.25 4.22 - 5.81 MIL/uL   Hemoglobin 12.9 (L) 13.0 - 17.0 g/dL   HCT 36.8 (L) 39.0 - 52.0 %   MCV 86.6 78.0 - 100.0 fL   MCH 30.4 26.0 - 34.0 pg   MCHC 35.1 30.0 - 36.0 g/dL   RDW 12.8 11.5 - 15.5 %   Platelets 356 150 - 400 K/uL   Neutrophils Relative % 61 %   Neutro Abs 2.9 1.7 - 7.7 K/uL   Lymphocytes Relative 27 %   Lymphs Abs 1.3 0.7 - 4.0 K/uL   Monocytes Relative 7 %   Monocytes Absolute 0.3 0.1 - 1.0 K/uL   Eosinophils Relative 5 %   Eosinophils Absolute 0.2 0.0 - 0.7 K/uL   Basophils Relative 0 %   Basophils Absolute 0.0 0.0 - 0.1 K/uL  CBG monitoring,  ED     Status: Abnormal   Collection Time: 12/22/16  1:46 PM  Result Value Ref Range   Glucose-Capillary 121 (H) 65 - 99 mg/dL    Current Facility-Administered Medications  Medication Dose Route Frequency Provider Last Rate Last Dose  . acetaminophen (TYLENOL) tablet 500 mg  500 mg Oral Q6H PRN Drenda Freeze, MD      . gabapentin (NEURONTIN) capsule 300 mg  300 mg Oral  BID Corena Pilgrim, MD      . ibuprofen (ADVIL,MOTRIN) tablet 600 mg  600 mg Oral Q8H PRN Drenda Freeze, MD      . ondansetron [facility]) tablet 4 mg  4 mg Oral Q8H PRN Drenda Freeze, MD       Current Outpatient Prescriptions  Medication Sig Dispense Refill  . bacitracin ointment Apply 1 application topically 2 (two) times daily. Abrasion (Patient not taking: Reported on 12/22/2016) 120 g 0    Musculoskeletal: Strength & Muscle Tone: within normal limits Gait & Station: normal Patient leans: N/A  Psychiatric Specialty Exam: Physical Exam  Constitutional: He is oriented to person, place, and time. He appears well-developed and well-nourished.  HENT:  Head: Normocephalic.  Neck: Normal range of motion.  Respiratory: Effort normal.  Musculoskeletal: Normal range of motion.  Neurological: He is alert and oriented to person, place, and time.  Psychiatric: He has a normal mood and affect. His speech is normal and behavior is normal. Judgment and thought content normal. Cognition and memory are normal.    Review of Systems  Psychiatric/Behavioral: Positive for substance abuse.  All other systems reviewed and are negative.   Blood pressure (!) 118/59, pulse 84, temperature 97.8 F (36.6 C), temperature source Oral, resp. rate 16, height 5' 6"  (1.676 m), weight 77.1 kg (170 lb), SpO2 99 %.Body mass index is 27.44 kg/m.  General Appearance: Casual  Eye Contact:  Good  Speech:  Normal Rate  Volume:  Normal  Mood:  Irritable  Affect:  Congruent  Thought Process:  Coherent and Descriptions of  Associations: Intact  Orientation:  Full (Time, Place, and Person)  Thought Content:  WDL and Logical  Suicidal Thoughts:  No  Homicidal Thoughts:  No  Memory:  Immediate;   Good Recent;   Good Remote;   Good  Judgement:  Fair  Insight:  Fair  Psychomotor Activity:  Normal  Concentration:  Concentration: Good and Attention Span: Good  Recall:  Good  Fund of Knowledge:  Fair  Language:  Good  Akathisia:  No  Handed:  Right  AIMS (if indicated):     Assets:  Leisure Time Physical Health Resilience Social Support  ADL's:  Intact  Cognition:  WNL  Sleep:        Treatment Plan Summary: Daily contact with patient to assess and evaluate symptoms and progress in treatment, Medication management and Plan substance induced psychotic disorder with onset suring intoxication with delusions:  -Crisis stabilization -Medication management:  Continued gabapentin 300 mg BID for withdrawal symptoms -Individual and substance abuse counseling -Peer support referral  Disposition: No evidence of imminent risk to self or others at present.    Waylan Boga, NP 12/24/2016 11:07 AM  Patient seen face-to-face for psychiatric evaluation, chart reviewed and case discussed with the physician extender and developed treatment plan. Reviewed the information documented and agree with the treatment plan. Corena Pilgrim, MD

## 2016-12-24 NOTE — BHH Suicide Risk Assessment (Signed)
Suicide Risk Assessment  Discharge Assessment   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 He appears well-developed and well-nourished.  HENT:  Head: Normocephalic.  Neck: Normal range of motion.  Respiratory: Effort normal.  Musculoskeletal: Normal range of motion.  Neurological: He is alert and oriented to person, place, and time.  Psychiatric: He has a normal mood and affect. His speech is normal and behavior is normal. Judgment and thought content normal. Cognition and memory are normal.    Review of Systems  Psychiatric/Behavioral: Positive for substance abuse.  All other systems reviewed and are negative.   Blood pressure (!) 118/59, pulse 84, temperature 97.8 F (36.6 C), temperature source Oral, resp. rate 16, height  (1.676 m), weight 77.1 kg (170 lb), SpO2 99 %.Body mass index is 27.44 kg/m.  General Appearance: Casual  Eye Contact:  Good  Speech:  Normal Rate  Volume:  Normal  Mood:  Irritable  Affect:  Congruent  Thought Process:  Coherent and Descriptions of Associations: Intact  Orientation:  Full (Time, Place, and Person)  Thought Content:  WDL and Logical  Suicidal Thoughts:  No  Homicidal Thoughts:  No   Memory:  Immediate;   Good Recent;   Good Remote;   Good  Judgement:  Fair  Insight:  Fair  Psychomotor Activity:  Normal  Concentration:  Concentration: Good and Attention Span: Good  Recall:  Good  Fund of Knowledge:  Fair  Language:  Good  Akathisia:  No  Handed:  Right  AIMS (if indicated):     Assets:  Leisure Time Physical Health Resilience Social Support  ADL's:  Intact  Cognition:  WNL  Sleep:      Mental Status Per Nursing Assessment::   On Admission:   crystal meth with psychosis  Demographic Factors:  Male  Loss Factors: NA  Historical Factors: NA  Risk Reduction Factors:   Sense of responsibility to family  Continued Clinical Symptoms:  Irritability  Cognitive Features That Contribute To Risk:  None    Suicide Risk:  Minimal: No identifiable suicidal ideation.  Patients presenting with no risk factors but with morbid ruminations; may be classified as minimal risk based on the severity of the depressive symptoms    Plan Of Care/Follow-up recommendations:  Activity:  as tolerated Diet:  heart healthy diet  LORD, JAMISON, NP 12/24/2016, 2:17 PM

## 2016-12-24 NOTE — ED Notes (Signed)
Pt d/c home per MD order. [place] Interpretor Ipad used to review discharge summary. Pt verbalizes understanding of discharge summary. RX provided. Pt signed e-signature. Personal property returned. Ambulatory off unit with MHT.

## 2016-12-24 NOTE — BH Assessment (Addendum)
B[facility] Assessment Progress Note  Per Thedore Mins, MD, this pt does not require psychiatric hospitalization at this time.  Pt presents under IVC initiated by law enforcement, and upheld by Leata Mouse, MD, which Dr Jannifer Franklin has rescinded.  Pt is to be [facility] from [facility] with referral information for [facility].  This has been included in pt's discharge instructions.  Pt would also benefit from seeing a Peer Support Specialist; Arlys John agrees to speak with the pt.  Pt's nurse, Morrie Sheldon, has been notified.  Doylene Canning, MA Triage Specialist [phone]

## 2016-12-24 NOTE — Patient Outreach (Signed)
ED Peer Support Specialist Patient Intake (Complete at intake & 30-60 Day Follow-up)  Name: [name]  MRN: [id number]  Age: 36 y.o.   Date of Admission: 12/24/2016  Intake: Initial Comments:      Primary Reason Admitted: Pt reportedly aggressive and irritable on arrival to ED. According to the IVC, pt was observed hallucinating and pointing into the air saying he was speaking with the [employer]. During the assessment, pt was calm and cooperative  Lab values: Alcohol/ETOH: Positive Positive UDS? Yes Amphetamines: No Barbiturates: No Benzodiazepines: No Cocaine: No Opiates: No Cannabinoids:    Demographic information: Gender: Male Ethnicity:   Marital Status: Single Insurance Status: Uninsured/Self-pay Control and instrumentation engineer (Work Engineer, agricultural, Sales executive, etc.: No Lives with: Alone Living situation: House/Apartment  Reported Patient History: Patient reported health conditions: None Patient aware of HIV and hepatitis status: No  In past year, has patient visited ED for any reason? No  Number of ED visits:    Reason(s) for visit:    In past year, has patient been hospitalized for any reason? No  Number of hospitalizations:    Reason(s) for hospitalization:    In past year, has patient been arrested? No  Number of arrests:    Reason(s) for arrest:    In past year, has patient been incarcerated? No  Number of incarcerations:    Reason(s) for incarceration:    In past year, has patient received medication-assisted treatment? No  In past year, patient received the following treatments: Individual therapy  In past year, has patient received any harm reduction services? No  Did this include any of the following?    In past year, has patient received care from a mental health provider for diagnosis other than SUD? No  In past year, is this first time patient has overdosed? No  Number of past overdoses:    In past year, is  this first time patient has been hospitalized for an overdose?    Number of hospitalizations for overdose(s):    Is patient currently receiving treatment for a mental health diagnosis?    Patient reports experiencing difficulty participating in SUD treatment: No    Most important reason(s) for this difficulty?    Has patient received prior services for treatment? No  In past, patient has received services from following agencies:    Plan of Care:  Suggested follow up at these agencies/treatment centers:    Other information:  CPSS Spoke with Pt completed Peer assessment when Pt stated he does not speak anymore english at the time. CPSS made Pt aware that he will have information to attend ADS services.    Arlys John Zayla Agar, CPSS  12/24/2016 11:19 AM

## 2017-03-08 ENCOUNTER — Emergency Department (HOSPITAL_COMMUNITY)
Admission: EM | Admit: 2017-03-08 | Discharge: 2017-03-08 | Disposition: A | Discharge status: Home / Self Care | Payer: Self-pay | Attending: Emergency Medicine | Admitting: Emergency Medicine

## 2017-03-08 ENCOUNTER — Encounter (HOSPITAL_COMMUNITY): Payer: Self-pay | Admitting: Emergency Medicine

## 2017-03-08 ENCOUNTER — Other Ambulatory Visit: Payer: Self-pay

## 2017-03-08 DIAGNOSIS — Z5900 Homelessness unspecified: Secondary | ICD-10-CM

## 2017-03-08 LAB — BASIC METABOLIC PANEL
Anion gap: 13 (ref 5–15)
BUN: 19 mg/dL (ref 6–20)
CALCIUM: 9.6 mg/dL (ref 8.9–10.3)
CO2: 22 mmol/L (ref 22–32)
CREATININE: 1.27 mg/dL — AB (ref 0.61–1.24)
Chloride: 95 mmol/L — ABNORMAL LOW (ref 101–111)
GFR calc non Af Amer: >60 mL/min (ref >60)
GLUCOSE: 152 mg/dL — AB (ref 65–99)
Potassium: 3.8 mmol/L (ref 3.5–5.1)
Sodium: 130 mmol/L — ABNORMAL LOW (ref 135–145)

## 2017-03-08 LAB — CBC
HCT: 37.9 % — ABNORMAL LOW (ref 39.0–52.0)
Hemoglobin: 13.1 g/dL (ref 13.0–17.0)
MCH: 29.8 pg (ref 26.0–34.0)
MCHC: 34.6 g/dL (ref 30.0–36.0)
MCV: 86.3 fL (ref 78.0–100.0)
Platelets: 408 K/uL — ABNORMAL HIGH (ref 150–400)
RBC: 4.39 MIL/uL (ref 4.22–5.81)
RDW: 12.8 % (ref 11.5–15.5)
WBC: 13.3 K/uL — ABNORMAL HIGH (ref 4.0–10.5)

## 2017-03-08 LAB — ETHANOL: Alcohol, Ethyl (B): <10 mg/dL

## 2017-03-08 NOTE — ED Notes (Signed)
Pt talking in his sleep

## 2017-03-08 NOTE — ED Triage Notes (Signed)
Patient is requesting to go to behavior health. Patient is wanting help detox from alcohol and crystal meth. Patient is not having pain. Patient last time having alcohol and drugs was day before yesterday.

## 2017-03-08 NOTE — ED Notes (Signed)
Pt unable to provide urine specimen @ this time.

## 2017-03-08 NOTE — ED Notes (Signed)
Pt provided jacket, pants & shoes.  Pt stated "Can I sleep a little longer?"  Informed after changing into clothes will be escorted to exit.
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
## 2017-03-08 NOTE — ED Notes (Signed)
Questioned if pt had d/c papers.  Pt patted the bag and stated "yes".

## 2017-03-08 NOTE — ED Provider Notes (Signed)
[facility]-EMERGENCY DEPT Provider Note   CSN: [id number] Arrival date & time: 03/08/17  0130     History   Chief Complaint Chief Complaint  Patient presents with  . Detox  . Homeless    HPI [name] is a 36 y.o. male.  The history is provided by the patient. The history is limited by a language barrier. A language interpreter was used.  Illness  This is a new problem. The current episode started more than 2 days ago. The problem occurs constantly. The problem has not changed since onset.Pertinent negatives include no chest pain, no abdominal pain, no headaches and no shortness of breath. Nothing aggravates the symptoms. Nothing relieves the symptoms. The treatment provided no relief.  Apparently told triage he needs detox from ETOH and meth.  But patient states to me and his nurse via interpretor that he rarely drinks alcohol and can't remember when the last time he drank was.  Used meth several days ago.  He is homeless and hasn't eaten in a day.  He denies SI or HI.    History reviewed. No pertinent past medical history.  Patient Active Problem List   Diagnosis Date Noted  . 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 NO PAST SURGERIES    . ORIF ULNAR FRACTURE Left 01/28/2013   Procedure:  LEFT ULNAR OPEN REDUCTION INTERNAL FIXATION (ORIF) ;  Surgeon: Sharma CovertFred W Ortmann, MD;  [facility];  Service: Orthopedics;  Laterality: Left;       Home Medications    Prior to Admission medications   Medication Sig Start Date End Date Taking? Authorizing Provider  bacitracin ointment Apply 1 application topically 2 (two) times daily. Abrasion Patient not taking: Reported on 12/22/2016  12/05/16   Oneta RackLewis, Tanika N, NP  gabapentin (NEURONTIN) 300 MG capsule Take 1 capsule (300 mg total) by mouth 2 (two) times daily. Patient not taking: Reported on 03/08/2017 12/24/16   Charm RingsLord, Jamison Y, NP    Family History History reviewed. No pertinent family history.  Social History Social History   Tobacco Use  . Smoking status: Never Smoker  . Smokeless tobacco: Never Used  Substance Use Topics  . Alcohol use: Yes    Comment: rare  . Drug use: Yes    Comment: unknown substance      Allergies   Patient has no known allergies.   Review of Systems Review of Systems  HENT: Negative for sore throat, tinnitus, trouble swallowing and voice change.   Respiratory: Negative for cough and shortness of breath.   Cardiovascular: Negative for chest pain.  Gastrointestinal: Negative for abdominal pain.  Genitourinary: Negative for dysuria.  Neurological: Negative for headaches.  Psychiatric/Behavioral: Negative for hallucinations, self-injury and suicidal ideas. The patient is not nervous/anxious.   All other systems reviewed and are negative.    Physical Exam Updated Vital Signs BP 108/72 (BP Location: Left Arm)   Pulse 98   Temp 97.8 F (36.6 C) (Oral)   Resp 20   Ht 5\' 8"  (1.727 m)   Wt 81.6 kg (180 lb)   SpO2 100%   BMI 27.37 kg/m   Physical Exam  Constitutional: He is oriented to person, place, and time. He appears well-developed and well-nourished. No distress.  HENT:  Head:  Normocephalic and atraumatic.  Mouth/Throat: No oropharyngeal exudate.  Eyes: Conjunctivae and EOM are normal. Pupils are equal, round, and reactive to light.  Neck: Normal range of motion. Neck supple. No JVD present.  Cardiovascular: Normal rate, regular rhythm, normal heart sounds and intact distal pulses.  Pulmonary/Chest: Effort normal and breath sounds normal. No stridor. He has no wheezes. He has no rales.  Abdominal: Soft. Bowel sounds are normal. He exhibits no mass. There is no  tenderness. There is no rebound and no guarding.  Musculoskeletal: Normal range of motion.  Neurological: He is alert and oriented to person, place, and time. He displays normal reflexes.  Skin: Skin is warm and dry. Capillary refill takes less than 2 seconds.  Psychiatric: He has a normal mood and affect.     ED Treatments / Results  Labs (all labs ordered are listed, but only abnormal results are displayed)  Results for orders placed or performed during the hospital encounter of 03/08/17  Ethanol  Result Value Ref Range   Alcohol, Ethyl (B) <10 <10 mg/dL  cbc  Result Value Ref Range   WBC 13.3 (H) 4.0 - 10.5 K/uL   RBC 4.39 4.22 - 5.81 MIL/uL   Hemoglobin 13.1 13.0 - 17.0 g/dL   HCT 16.137.9 (L) 09.639.0 - 04.552.0 %   MCV 86.3 78.0 - 100.0 fL   MCH 29.8 26.0 - 34.0 pg   MCHC 34.6 30.0 - 36.0 g/dL   RDW 40.912.8 81.111.5 - 91.415.5 %   Platelets 408 (H) 150 - 400 K/uL  Basic metabolic panel  Result Value Ref Range   Sodium 130 (L) 135 - 145 mmol/L   Potassium 3.8 3.5 - 5.1 mmol/L   Chloride 95 (L) 101 - 111 mmol/L   CO2 22 22 - 32 mmol/L   Glucose, Bld 152 (H) 65 - 99 mg/dL   BUN 19 6 - 20 mg/dL   Creatinine, Ser 7.821.27 (H) 0.61 - 1.24 mg/dL   Calcium 9.6 8.9 - 95.610.3 mg/dL   GFR calc non Af Amer >60 >60 mL/min   GFR calc Af Amer >60 >60 mL/min   Anion gap 13 5 - 15   No results found.    Radiology No results found.  Procedures Procedures (including critical care time)  Medications Ordered in ED Medications - No data to display     Final Clinical Impressions(s) / ED Diagnoses   Final diagnoses:  Homelessness   Well appearing PO challenged and rested in the ED.  No signs of infection clinically on exam.  Meets no criteria for inpatient psychiatric care.     Return for fevers > 100.4, redness or drainage at the site global weakness, stiff neck, intractable vomiting, or diarrhea, abdominal pain, Inability to tolerate liquids or food, cough, altered mental status or any concerns. No  signs of systemic illness or infection. The patient is nontoxic-appearing on exam and vital signs are within normal limits.    I have reviewed the triage vital signs and the nursing notes. Pertinent labs &imaging results that were available during my care of the patient were reviewed by me and considered in my medical decision making (see chart for details).  After history, exam, and medical workup I feel the patient has been appropriately medically screened and is safe for discharge home. Pertinent diagnoses were discussed with the patient. Patient was given return precautions     Kindrick Lankford, MD 03/08/17 [phone]

## 2017-03-08 NOTE — ED Notes (Signed)
Pt d/c papers found in bed linens.

## 2017-03-08 NOTE — ED Notes (Signed)
Patient wanded by security. 

## 2017-07-09 ENCOUNTER — Emergency Department (HOSPITAL_COMMUNITY): Payer: Self-pay

## 2017-07-09 ENCOUNTER — Encounter (HOSPITAL_COMMUNITY): Payer: Self-pay | Admitting: Emergency Medicine

## 2017-07-09 ENCOUNTER — Other Ambulatory Visit: Payer: Self-pay

## 2017-07-09 ENCOUNTER — Emergency Department (HOSPITAL_COMMUNITY)
Admission: EM | Admit: 2017-07-09 | Discharge: 2017-07-10 | Payer: Self-pay | Attending: Emergency Medicine | Admitting: Emergency Medicine

## 2017-07-09 DIAGNOSIS — F17200 Nicotine dependence, unspecified, uncomplicated: Secondary | ICD-10-CM | POA: Insufficient documentation

## 2017-07-09 DIAGNOSIS — R4182 Altered mental status, unspecified: Secondary | ICD-10-CM | POA: Insufficient documentation

## 2017-07-09 DIAGNOSIS — F1910 Other psychoactive substance abuse, uncomplicated: Secondary | ICD-10-CM | POA: Insufficient documentation

## 2017-07-09 LAB — CBC WITH DIFFERENTIAL/PLATELET
Basophils Absolute: 0 10*3/uL (ref 0.0–0.1)
Basophils Relative: 0 %
Eosinophils Absolute: 0.2 10*3/uL (ref 0.0–0.7)
Eosinophils Relative: 1 %
HCT: 38.5 % — ABNORMAL LOW (ref 39.0–52.0)
Hemoglobin: 13.3 g/dL (ref 13.0–17.0)
Lymphocytes Relative: 10 %
Lymphs Abs: 1.3 10*3/uL (ref 0.7–4.0)
MCH: 30.2 pg (ref 26.0–34.0)
MCHC: 34.5 g/dL (ref 30.0–36.0)
MCV: 87.5 fL (ref 78.0–100.0)
Monocytes Absolute: 0.4 10*3/uL (ref 0.1–1.0)
Monocytes Relative: 3 %
Neutro Abs: 11.6 10*3/uL — ABNORMAL HIGH (ref 1.7–7.7)
Neutrophils Relative %: 86 %
Platelets: 447 10*3/uL — ABNORMAL HIGH (ref 150–400)
RBC: 4.4 MIL/uL (ref 4.22–5.81)
RDW: 12.8 % (ref 11.5–15.5)
WBC: 13.5 10*3/uL — ABNORMAL HIGH (ref 4.0–10.5)

## 2017-07-09 LAB — COMPREHENSIVE METABOLIC PANEL
ALT: 23 U/L (ref 17–63)
AST: 33 U/L (ref 15–41)
Albumin: 4.6 g/dL (ref 3.5–5.0)
Alkaline Phosphatase: 72 U/L (ref 38–126)
Anion gap: 12 (ref 5–15)
BUN: 10 mg/dL (ref 6–20)
CO2: 23 mmol/L (ref 22–32)
Calcium: 9.6 mg/dL (ref 8.9–10.3)
Chloride: 103 mmol/L (ref 101–111)
Creatinine, Ser: 0.81 mg/dL (ref 0.61–1.24)
GFR calc Af Amer: >60 mL/min (ref >60)
GFR calc non Af Amer: >60 mL/min (ref >60)
Glucose, Bld: 115 mg/dL — ABNORMAL HIGH (ref 65–99)
Potassium: 3.4 mmol/L — ABNORMAL LOW (ref 3.5–5.1)
Sodium: 138 mmol/L (ref 135–145)
Total Bilirubin: 0.7 mg/dL (ref 0.3–1.2)
Total Protein: 8.3 g/dL — ABNORMAL HIGH (ref 6.5–8.1)
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
## 2017-07-10 LAB — SALICYLATE LEVEL: Salicylate Lvl: <7 mg/dL (ref 2.8–30.0)

## 2017-07-10 LAB — ACETAMINOPHEN LEVEL: Acetaminophen (Tylenol), Serum: <10 ug/mL — ABNORMAL LOW (ref 10–30)

## 2017-07-10 LAB — ETHANOL: Alcohol, Ethyl (B): <10 mg/dL (ref <10)

## 2017-09-06 ENCOUNTER — Encounter (HOSPITAL_COMMUNITY): Payer: Self-pay | Admitting: Emergency Medicine

## 2017-10-20 ENCOUNTER — Emergency Department (HOSPITAL_COMMUNITY)
Admission: EM | Admit: 2017-10-20 | Discharge: 2017-10-20 | Disposition: A | Discharge status: Home / Self Care | Payer: No Typology Code available for payment source | Attending: Emergency Medicine | Admitting: Emergency Medicine

## 2017-10-20 ENCOUNTER — Encounter (HOSPITAL_COMMUNITY): Payer: Self-pay | Admitting: Emergency Medicine

## 2017-10-20 ENCOUNTER — Emergency Department (HOSPITAL_COMMUNITY): Payer: No Typology Code available for payment source

## 2017-10-20 ENCOUNTER — Other Ambulatory Visit: Payer: Self-pay

## 2017-10-20 DIAGNOSIS — S0990XA Unspecified injury of head, initial encounter: Secondary | ICD-10-CM | POA: Diagnosis present

## 2017-10-20 DIAGNOSIS — Y9389 Activity, other specified: Secondary | ICD-10-CM | POA: Insufficient documentation

## 2017-10-20 DIAGNOSIS — Y999 Unspecified external cause status: Secondary | ICD-10-CM | POA: Insufficient documentation

## 2017-10-20 DIAGNOSIS — Y929 Unspecified place or not applicable: Secondary | ICD-10-CM | POA: Diagnosis not present

## 2017-10-20 DIAGNOSIS — S0083XA Contusion of other part of head, initial encounter: Secondary | ICD-10-CM | POA: Diagnosis not present

## 2017-10-20 DIAGNOSIS — F17200 Nicotine dependence, unspecified, uncomplicated: Secondary | ICD-10-CM | POA: Diagnosis not present

## 2017-10-20 MED ORDER — METHOCARBAMOL 500 MG PO TABS: 500.0000 mg | ORAL_TABLET | Freq: Two times a day (BID) | ORAL | 0 refills | Status: DC
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
## 2017-10-20 NOTE — ED Notes (Signed)
Patient transported to CT 

## 2017-10-20 NOTE — ED Notes (Signed)
Patient returned from CT

## 2017-10-20 NOTE — ED Notes (Signed)
Pt has ambulated to bathroom independently
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
## 2019-04-28 ENCOUNTER — Ambulatory Visit (HOSPITAL_COMMUNITY)
Admission: EM | Admit: 2019-04-28 | Discharge: 2019-04-28 | Disposition: A | Discharge status: Home / Self Care | Payer: HRSA Program | Attending: Family Medicine | Admitting: Family Medicine

## 2019-04-28 ENCOUNTER — Encounter (HOSPITAL_COMMUNITY): Payer: Self-pay

## 2019-04-28 ENCOUNTER — Other Ambulatory Visit: Payer: Self-pay

## 2019-04-28 DIAGNOSIS — Z79899 Other long term (current) drug therapy: Secondary | ICD-10-CM | POA: Diagnosis not present

## 2019-04-28 DIAGNOSIS — R05 Cough: Secondary | ICD-10-CM

## 2019-04-28 DIAGNOSIS — F17200 Nicotine dependence, unspecified, uncomplicated: Secondary | ICD-10-CM | POA: Diagnosis not present

## 2019-04-28 DIAGNOSIS — R5381 Other malaise: Secondary | ICD-10-CM | POA: Diagnosis present

## 2019-04-28 DIAGNOSIS — H9201 Otalgia, right ear: Secondary | ICD-10-CM

## 2019-04-28 DIAGNOSIS — R509 Fever, unspecified: Secondary | ICD-10-CM

## 2019-04-28 DIAGNOSIS — R197 Diarrhea, unspecified: Secondary | ICD-10-CM

## 2019-04-28 DIAGNOSIS — B349 Viral infection, unspecified: Secondary | ICD-10-CM | POA: Diagnosis present

## 2019-04-28 DIAGNOSIS — R0789 Other chest pain: Secondary | ICD-10-CM

## 2019-04-28 DIAGNOSIS — R059 Cough, unspecified: Secondary | ICD-10-CM

## 2019-04-28 DIAGNOSIS — U071 COVID-19: Secondary | ICD-10-CM | POA: Insufficient documentation

## 2019-04-28 MED ORDER — CETIRIZINE HCL 10 MG PO TABS: 10.0000 mg | ORAL_TABLET | Freq: Every day | ORAL | 0 refills | Status: DC

## 2019-04-28 MED ORDER — PROMETHAZINE-DM 6.25-15 MG/5ML PO SYRP: 5.0000 mL | ORAL_SOLUTION | Freq: Every evening | ORAL | 0 refills | Status: DC | PRN

## 2019-04-28 MED ORDER — PSEUDOEPHEDRINE HCL 60 MG PO TABS: 60.0000 mg | ORAL_TABLET | Freq: Three times a day (TID) | ORAL | 0 refills | Status: DC | PRN

## 2019-04-28 MED ORDER — BENZONATATE 100 MG PO CAPS: 100.0000 mg | ORAL_CAPSULE | Freq: Three times a day (TID) | ORAL | 0 refills | Status: DC | PRN

## 2019-04-28 NOTE — ED Provider Notes (Signed)
[facility]   MRN: [id number] DOB: [birth year]  Subjective:   [name] is a 39 y.o. male presenting for 5-6 day history of acute onset moderate malaise.  ROS below.  Has tried DayQuil and NyQuil with good relief.  Has had multiple sick contacts at work.  Does not know if they had Covid or not.  No current facility-administered medications for this encounter.  Current Outpatient Medications:  .  gabapentin (NEURONTIN) 300 MG capsule, Take 1 capsule (300 mg total) by mouth 2 (two) times daily. (Patient not taking: Reported on 03/08/2017), Disp: 60 capsule, Rfl: 0 .  methocarbamol (ROBAXIN) 500 MG tablet, Take 1 tablet (500 mg total) by mouth 2 (two) times daily., Disp: 20 tablet, Rfl: 0   No Known Allergies  History reviewed. No pertinent past medical history.   Past Surgical History:  Procedure Laterality Date  . NO PAST SURGERIES    . ORIF ULNAR FRACTURE Left 01/28/2013   Procedure:  LEFT ULNAR OPEN REDUCTION INTERNAL FIXATION (ORIF) ;  Surgeon: Sharma Covert, MD;  Location: [facility] OR;  Service: Orthopedics;  Laterality: Left;    History reviewed. No pertinent family history.  Social History   Tobacco Use  . Smoking status: Current Every Day Smoker  . Smokeless tobacco: Never Used  Substance Use Topics  . Alcohol use: Yes    Comment: rare  . Drug use: Yes    Types: Methamphetamines    Comment: unknown substance     Review of Systems  Constitutional: Positive for fever and malaise/fatigue.  HENT: Positive for ear pain and sore throat. Negative for congestion and sinus pain.   Eyes: Negative for discharge and redness.  Respiratory: Positive for cough and shortness of breath. Negative for hemoptysis and wheezing.   Cardiovascular: Positive for chest pain.  Gastrointestinal: Negative for abdominal pain, diarrhea, nausea and vomiting.  Genitourinary: Negative for dysuria, flank pain and hematuria.  Musculoskeletal: Positive for myalgias.  Skin:  Negative for rash.  Neurological: Negative for dizziness, weakness and headaches.  Psychiatric/Behavioral: Negative for depression and substance abuse.     Objective:   Vitals: BP 126/79 (BP Location: Right Arm)   Pulse 98   Temp 99.1 F (37.3 C) (Oral)   Resp 18   Wt 216 lb (98 kg)   SpO2 100%   BMI 30.99 kg/m   Physical Exam Constitutional:      General: He is not in acute distress.    Appearance: Normal appearance. He is well-developed and normal weight. He is not ill-appearing, toxic-appearing or diaphoretic.  HENT:     Head: Normocephalic and atraumatic.     Right Ear: Tympanic membrane, ear canal and external ear normal. There is no impacted cerumen.     Left Ear: Tympanic membrane, ear canal and external ear normal. There is no impacted cerumen.     Nose: Nose normal. No congestion or rhinorrhea.     Mouth/Throat:     Mouth: Mucous membranes are moist.     Pharynx: No oropharyngeal exudate or posterior oropharyngeal erythema.  Eyes:     General: No scleral icterus.       Right eye: No discharge.        Left eye: No discharge.     Extraocular Movements: Extraocular movements intact.     Conjunctiva/sclera: Conjunctivae normal.     Pupils: Pupils are equal, round, and reactive to light.  Cardiovascular:     Rate and Rhythm: Normal rate and regular rhythm.  Heart sounds: Normal heart sounds. No murmur. No friction rub. No gallop.   Pulmonary:     Effort: Pulmonary effort is normal. No respiratory distress.     Breath sounds: Normal breath sounds. No stridor. No wheezing, rhonchi or rales.  Musculoskeletal:     Cervical back: Normal range of motion and neck supple. No rigidity. No muscular tenderness.  Neurological:     General: No focal deficit present.     Mental Status: He is alert and oriented to person, place, and time.  Psychiatric:        Mood and Affect: Mood normal.        Behavior: Behavior normal.        Thought Content: Thought content normal.       Assessment and Plan :   1. Viral syndrome   2. Malaise   3. Right ear pain   4. Diarrhea, unspecified type   5. Fever, unspecified   6. Cough   7. Atypical chest pain     Will manage for viral illness such as viral URI, viral rhinitis, possible COVID-19. Counseled patient on nature of COVID-19 including modes of transmission, diagnostic testing, management and supportive care.  Offered symptomatic relief. COVID 19 testing is pending. Counseled patient on potential for adverse effects with medications prescribed/recommended today, ER and return-to-clinic precautions discussed, patient verbalized understanding.     Jaynee Eagles, [place] 04/28/19 1415

## 2019-04-28 NOTE — ED Triage Notes (Signed)
Pt states he has had fever, diarrhea and right ear discomfort x 4 days. Pt states he just feels bad.

## 2019-04-28 NOTE — Discharge Instructions (Addendum)
Para el dolor de garganta o tos puede usar un t de miel. Use 3 cucharaditas de miel con jugo exprimido de [employer]. Coloque trozos de Microbiologist en 1/2-1 taza de agua y caliente sobre la estufa. Luego mezcle los ingredientes y repita cada 4 horas. Tome Tylenol 500 mg cada 6 horas para fiebre, dolor de cuerpos. Hidrata muy bien con al menos 2 litros de agua al dia. Coma comidas ligeras como sopas para [employer] electrolitos y coma frutas, verduras. Comience un antihistamnico como Zyrtec (cetirizina) 10mg  al dia. Puede usar pseudoefedrina (Sudafed) de venta libre para el goteo posnasal, congestin a una dosis de 60 mg cada 8 horas o 120 mg cada 12 horas. Use el jarabe por la noche para su tos y las capsulas durante el dia.

## 2019-05-01 ENCOUNTER — Telehealth (HOSPITAL_COMMUNITY): Payer: Self-pay | Admitting: Emergency Medicine

## 2019-05-01 ENCOUNTER — Telehealth: Payer: Self-pay | Admitting: Nurse Practitioner

## 2019-05-01 NOTE — Telephone Encounter (Signed)
With spanish interpreter,  Your test for COVID-19 was positive, meaning that you were infected with the novel coronavirus and could give the germ to others.  Please continue isolation at home for at least 10 days since the start of your symptoms. If you do not have symptoms, please isolate at home for 10 days from the day you were tested. Once you complete your 10 day quarantine, you may return to normal activities as long as you've not had a fever for over 24 hours(without taking fever reducing medicine) and your symptoms are improving. Please continue good preventive care measures, including:  frequent hand-washing, avoid touching your face, cover coughs/sneezes, stay out of crowds and keep a 6 foot distance from others.  Go to the nearest hospital emergency room if fever/cough/breathlessness are severe or illness seems like a threat to life.  Patient contacted by phone and made aware of    results. Pt verbalized understanding and had all questions answered.

## 2019-05-01 NOTE — Telephone Encounter (Signed)
Called to discuss with [name] about Covid symptoms and the use of bamlanivimab, a monoclonal antibody infusion for those with mild to moderate Covid symptoms and at a high risk of hospitalization.    Unable to reach patient.     On chart review patient is not qualified for this infusion due to lack of identified risk factors and co-morbid conditions. However further discussion and assessment would be needed to confirm. If patient qualifies based on recent ED visit and documented onset of symptoms the last day patient would be eligible to receive treatment would be 05/03/19.      Patient Active Problem List   Diagnosis Date Noted  . 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 Brief psychotic disorder [facility])     Willette Alma, AGPCNP-BC Pager: [phone] Amion: N. Cousar

## 2019-05-06 LAB — NOVEL CORONAVIRUS, NAA (HOSP ORDER, SEND-OUT TO REF LAB; TAT 18-24 HRS): SARS-CoV-2, NAA: DETECTED — AB

## 2019-10-30 ENCOUNTER — Other Ambulatory Visit: Payer: Self-pay

## 2019-10-30 ENCOUNTER — Encounter (HOSPITAL_COMMUNITY): Payer: Self-pay

## 2019-10-30 ENCOUNTER — Ambulatory Visit (HOSPITAL_COMMUNITY)
Admission: EM | Admit: 2019-10-30 | Discharge: 2019-10-30 | Disposition: A | Discharge status: Home / Self Care | Payer: Self-pay | Attending: Urgent Care | Admitting: Urgent Care

## 2019-10-30 DIAGNOSIS — R300 Dysuria: Secondary | ICD-10-CM | POA: Insufficient documentation

## 2019-10-30 DIAGNOSIS — N481 Balanitis: Secondary | ICD-10-CM | POA: Insufficient documentation

## 2019-10-30 LAB — POCT URINALYSIS DIPSTICK, ED / UC
Bilirubin Urine: NEGATIVE
Glucose, UA: NEGATIVE mg/dL
Ketones, ur: 15 mg/dL — AB
Nitrite: NEGATIVE
Protein, ur: NEGATIVE mg/dL
Specific Gravity, Urine: >=1.03 (ref 1.005–1.030)
Urobilinogen, UA: 0.2 mg/dL (ref 0.0–1.0)
pH: 5 (ref 5.0–8.0)

## 2019-10-30 MED ORDER — CLOTRIMAZOLE-BETAMETHASONE 1-0.05 % EX CREA: TOPICAL_CREAM | CUTANEOUS | 0 refills | Status: DC
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
## 2019-11-02 LAB — CYTOLOGY, (ORAL, ANAL, URETHRAL) ANCILLARY ONLY
Chlamydia: NEGATIVE
Comment: NEGATIVE
Comment: NEGATIVE
Comment: NORMAL
Neisseria Gonorrhea: NEGATIVE
Trichomonas: NEGATIVE

## 2019-11-03 LAB — HSV CULTURE AND TYPING

## 2020-05-17 ENCOUNTER — Encounter (HOSPITAL_COMMUNITY): Payer: Self-pay | Admitting: Emergency Medicine

## 2020-05-17 ENCOUNTER — Ambulatory Visit (HOSPITAL_COMMUNITY)
Admission: EM | Admit: 2020-05-17 | Discharge: 2020-05-17 | Disposition: A | Discharge status: Home / Self Care | Payer: Self-pay | Attending: Internal Medicine | Admitting: Internal Medicine

## 2020-05-17 ENCOUNTER — Other Ambulatory Visit: Payer: Self-pay

## 2020-05-17 DIAGNOSIS — N481 Balanitis: Secondary | ICD-10-CM

## 2020-05-17 MED ORDER — FLUCONAZOLE 150 MG PO TABS: 150.0000 mg | ORAL_TABLET | ORAL | 0 refills | Status: DC

## 2020-05-17 MED ORDER — HYDROCORTISONE 1 % EX CREA: TOPICAL_CREAM | CUTANEOUS | 0 refills | Status: DC

## 2020-05-17 NOTE — ED Triage Notes (Addendum)
Patient c/o penile problem x 15-20 days.   Patient states " I was seen for chlamydia and was given medication, now I have lesions on the head of my penis that has flared back up".   Patient states he was given a medication previously that helped with symptoms but has now ran out of medication.  Patient endorses that lesions or at the tip of penis.   Patient endorses burning with urination.    Patient endorses "white discharge" has been expressed from lesions.   Patient denies any ABD pain or testicular pain.

## 2020-05-17 NOTE — Discharge Instructions (Addendum)
Please retract your foreskin and was with salt water twice daily. Keep the area area between the foreskin and head of penis dry. Apply medications as directed Return if symptoms worsen.

## 2020-05-19 NOTE — ED Provider Notes (Signed)
[facility]    CSN: [id number] Arrival date & time: 05/17/20  1925      History   Chief Complaint Chief Complaint  Patient presents with  . Penis Problem     HPI [name] is a 40 y.o. male comes to the urgent care with complaints of itchy rash around the head of the penis. Symptoms have been ongoing for the past 2 to 3 weeks. No pain. No discharge from the rash. Patient is an uncircumcised male who has been treated in the past for balanitis. Treatment resulted in improvement in his symptoms but he seems to have worsened over the past few weeks. No penile discharge. Patient is in a monogamous relationship and engages in unprotected sexual intercourse. He was evaluated for GC/chlamydia/trichomonas/HSV previously and all of the tests were negative.   HPI  History reviewed. No pertinent past medical history.  Patient Active Problem List   Diagnosis Date Noted  . 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 NO PAST SURGERIES    . ORIF ULNAR FRACTURE Left 01/28/2013   Procedure:  LEFT ULNAR OPEN REDUCTION INTERNAL FIXATION (ORIF) ;  Surgeon: Sharma Covert, MD;  Location: [facility];  Service: Orthopedics;  Laterality: Left;       Home Medications    Prior to Admission medications   Medication Sig Start Date End Date Taking? Authorizing Provider  fluconazole (DIFLUCAN) 150 MG tablet Take 1 tablet (150 mg total) by mouth every 3 (three) days. 05/17/20  Yes Prather Failla, Britta Mccreedy, MD  hydrocortisone cream 1 % Apply to affected area 2 times daily 05/17/20  Yes Jaquille Kau, Britta Mccreedy, MD  benzonatate (TESSALON) 100 MG capsule Take 1-2 capsules (100-200 mg total) by mouth 3 (three) times daily as needed. 04/28/19   Wallis Bamberg, PA-C  cetirizine (ZYRTEC ALLERGY) 10 MG tablet Take 1 tablet (10 mg total) by mouth daily. 04/28/19   Wallis Bamberg, PA-C  clotrimazole-betamethasone (LOTRISONE) cream Apply to affected area 2 times daily. 10/30/19   Wallis Bamberg, PA-C  gabapentin (NEURONTIN) 300 MG capsule Take 1 capsule (300 mg total) by mouth 2 (two) times daily. Patient not taking: No sig reported 12/24/16   Charm Rings, NP  methocarbamol (ROBAXIN) 500 MG tablet Take 1 tablet (500 mg total) by mouth 2 (two) times daily. 10/20/17   Khatri, Hina, PA-C  promethazine-dextromethorphan (PROMETHAZINE-DM) 6.25-15 MG/5ML syrup Take 5 mLs by mouth at bedtime as needed for cough. 04/28/19   Wallis Bamberg, PA-C  pseudoephedrine (SUDAFED) 60 MG tablet Take 1 tablet (60 mg total) by mouth every 8 (eight) hours as needed for congestion. 04/28/19   Wallis Bamberg, PA-C    Family History History reviewed. No pertinent family history.  Social History Social History   Tobacco Use  . Smoking status: Current Every Day Smoker  . Smokeless tobacco: Never Used  Vaping Use  . Vaping Use: Never used  Substance Use Topics  . Alcohol use: Yes    Comment: rare  . Drug use: Yes    Types: Methamphetamines    Comment: unknown substance      Allergies   Patient has no known allergies.   Review of Systems Review of Systems  Gastrointestinal: Negative.   Genitourinary: Negative for penile discharge, penile pain, penile swelling,  scrotal swelling and testicular pain.  Musculoskeletal: Negative.   Neurological: Negative.   Psychiatric/Behavioral: Negative.      Physical Exam Triage Vital Signs ED Triage Vitals  Enc Vitals Group     BP 05/17/20 1942 126/71     Pulse Rate 05/17/20 1942 98     Resp 05/17/20 1942 15     Temp 05/17/20 1942 98 F (36.7 C)     Temp Source 05/17/20 1942 Oral     SpO2 05/17/20 1942 98 %     Weight --      Height --      Head Circumference --      Peak Flow --      Pain Score 05/17/20 1940 9     Pain Loc  --      Pain Edu? --      Excl. in GC? --    No data found.  Updated Vital Signs BP 126/71 (BP Location: Right Arm)   Pulse 98   Temp 98 F (36.7 C) (Oral)   Resp 15   SpO2 98%   Visual Acuity Right Eye Distance:   Left Eye Distance:   Bilateral Distance:    Right Eye Near:   Left Eye Near:    Bilateral Near:     Physical Exam Vitals and nursing note reviewed.  Cardiovascular:     Pulses: Normal pulses.     Heart sounds: Normal heart sounds.  Genitourinary:    Comments: No penile swelling. Upon retracting the foreskin there was some smegma. No rash noted. Testicular exam was unremarkable. Neurological:     Mental Status: He is alert.      UC Treatments / Results  Labs (all labs ordered are listed, but only abnormal results are displayed) Labs Reviewed - No data to display  EKG   Radiology No results found.  Procedures Procedures (including critical care time)  Medications Ordered in UC Medications - No data to display  Initial Impression / Assessment and Plan / UC Course  I have reviewed the triage vital signs and the nursing notes.  Pertinent labs & imaging results that were available during my care of the patient were reviewed by me and considered in my medical decision making (see chart for details).     1. Balanitis circinata: Personal hygiene by way of washing with  water twice a day Hydrocortisone cream Fluconazole 150 mg x 1 dose Return precautions given Low index of suspicion for STD hence no testing for STD ordered. Final Clinical Impressions(s) / UC Diagnoses   Final diagnoses:  Balanitis circinata     Discharge Instructions     Please retract your foreskin and was with salt water twice daily. Keep the area area between the foreskin and head of penis dry. Apply medications as directed Return if symptoms worsen.   ED Prescriptions    Medication Sig Dispense Auth. Provider   hydrocortisone cream 1 % Apply to affected area 2  times daily 15 g [name], Britta Mccreedy, MD   fluconazole (DIFLUCAN) 150 MG tablet Take 1 tablet (150 mg total) by mouth every 3 (three) days. 2 tablet [name], Britta Mccreedy, MD     PDMP not reviewed this encounter.   Merrilee Jansky, MD 05/19/20 1340

## 2020-05-21 ENCOUNTER — Emergency Department (HOSPITAL_COMMUNITY): Payer: Self-pay

## 2020-05-21 ENCOUNTER — Emergency Department (HOSPITAL_COMMUNITY)
Admission: EM | Admit: 2020-05-21 | Discharge: 2020-05-22 | Disposition: A | Discharge status: Home / Self Care | Payer: Self-pay | Attending: Emergency Medicine | Admitting: Emergency Medicine

## 2020-05-21 DIAGNOSIS — T1490XA Injury, unspecified, initial encounter: Secondary | ICD-10-CM

## 2020-05-21 DIAGNOSIS — F17200 Nicotine dependence, unspecified, uncomplicated: Secondary | ICD-10-CM | POA: Insufficient documentation

## 2020-05-21 DIAGNOSIS — Z20822 Contact with and (suspected) exposure to covid-19: Secondary | ICD-10-CM | POA: Insufficient documentation

## 2020-05-21 DIAGNOSIS — F10129 Alcohol abuse with intoxication, unspecified: Secondary | ICD-10-CM | POA: Insufficient documentation

## 2020-05-21 DIAGNOSIS — Z23 Encounter for immunization: Secondary | ICD-10-CM | POA: Insufficient documentation

## 2020-05-21 DIAGNOSIS — S01112A Laceration without foreign body of left eyelid and periocular area, initial encounter: Secondary | ICD-10-CM | POA: Insufficient documentation

## 2020-05-21 DIAGNOSIS — S01312A Laceration without foreign body of left ear, initial encounter: Secondary | ICD-10-CM | POA: Insufficient documentation

## 2020-05-21 DIAGNOSIS — F10920 Alcohol use, unspecified with intoxication, uncomplicated: Secondary | ICD-10-CM

## 2020-05-21 DIAGNOSIS — T07XXXA Unspecified multiple injuries, initial encounter: Secondary | ICD-10-CM

## 2020-05-21 DIAGNOSIS — R000 Tachycardia, unspecified: Secondary | ICD-10-CM | POA: Insufficient documentation

## 2020-05-21 DIAGNOSIS — S0181XA Laceration without foreign body of other part of head, initial encounter: Secondary | ICD-10-CM

## 2020-05-21 DIAGNOSIS — S60221A Contusion of right hand, initial encounter: Secondary | ICD-10-CM | POA: Insufficient documentation

## 2020-05-21 DIAGNOSIS — S7012XA Contusion of left thigh, initial encounter: Secondary | ICD-10-CM | POA: Insufficient documentation

## 2020-05-21 DIAGNOSIS — S60222A Contusion of left hand, initial encounter: Secondary | ICD-10-CM | POA: Insufficient documentation

## 2020-05-21 DIAGNOSIS — S301XXA Contusion of abdominal wall, initial encounter: Secondary | ICD-10-CM | POA: Insufficient documentation

## 2020-05-21 DIAGNOSIS — S022XXA Fracture of nasal bones, initial encounter for closed fracture: Secondary | ICD-10-CM

## 2020-05-21 DIAGNOSIS — R739 Hyperglycemia, unspecified: Secondary | ICD-10-CM | POA: Insufficient documentation

## 2020-05-21 LAB — URINALYSIS, ROUTINE W REFLEX MICROSCOPIC
Bacteria, UA: NONE SEEN
Bilirubin Urine: NEGATIVE
Glucose, UA: >=500 mg/dL — AB
Ketones, ur: 5 mg/dL — AB
Leukocytes,Ua: NEGATIVE
Nitrite: NEGATIVE
Protein, ur: NEGATIVE mg/dL
Specific Gravity, Urine: 1.018 (ref 1.005–1.030)
pH: 5 (ref 5.0–8.0)

## 2020-05-21 LAB — CBC
HCT: 41.4 % (ref 39.0–52.0)
Hemoglobin: 14.2 g/dL (ref 13.0–17.0)
MCH: 30.7 pg (ref 26.0–34.0)
MCHC: 34.3 g/dL (ref 30.0–36.0)
MCV: 89.6 fL (ref 80.0–100.0)
Platelets: 343 10*3/uL (ref 150–400)
RBC: 4.62 MIL/uL (ref 4.22–5.81)
RDW: 11.6 % (ref 11.5–15.5)
WBC: 14.1 10*3/uL — ABNORMAL HIGH (ref 4.0–10.5)
nRBC: 0 % (ref 0.0–0.2)

## 2020-05-21 LAB — CK TOTAL AND CKMB (NOT AT ARMC)
CK, MB: 4.3 ng/mL (ref 0.5–5.0)
Relative Index: 1.4 (ref 0.0–2.5)
Total CK: 311 U/L (ref 49–397)

## 2020-05-21 LAB — COMPREHENSIVE METABOLIC PANEL
ALT: 140 U/L — ABNORMAL HIGH (ref 0–44)
AST: 87 U/L — ABNORMAL HIGH (ref 15–41)
Albumin: 4.1 g/dL (ref 3.5–5.0)
Alkaline Phosphatase: 154 U/L — ABNORMAL HIGH (ref 38–126)
Anion gap: 12 (ref 5–15)
BUN: 10 mg/dL (ref 6–20)
CO2: 18 mmol/L — ABNORMAL LOW (ref 22–32)
Calcium: 9.2 mg/dL (ref 8.9–10.3)
Chloride: 105 mmol/L (ref 98–111)
Creatinine, Ser: 0.79 mg/dL (ref 0.61–1.24)
GFR, Estimated: >60 mL/min (ref >60)
Glucose, Bld: 406 mg/dL — ABNORMAL HIGH (ref 70–99)
Potassium: 4.5 mmol/L (ref 3.5–5.1)
Sodium: 135 mmol/L (ref 135–145)
Total Bilirubin: 0.6 mg/dL (ref 0.3–1.2)
Total Protein: 7.8 g/dL (ref 6.5–8.1)

## 2020-05-21 LAB — I-STAT CHEM 8, ED
BUN: 12 mg/dL (ref 6–20)
Calcium, Ion: 1.13 mmol/L — ABNORMAL LOW (ref 1.15–1.40)
Chloride: 106 mmol/L (ref 98–111)
Creatinine, Ser: 0.8 mg/dL (ref 0.61–1.24)
Glucose, Bld: 418 mg/dL — ABNORMAL HIGH (ref 70–99)
HCT: 42 % (ref 39.0–52.0)
Hemoglobin: 14.3 g/dL (ref 13.0–17.0)
Potassium: 4.6 mmol/L (ref 3.5–5.1)
Sodium: 139 mmol/L (ref 135–145)
TCO2: 21 mmol/L — ABNORMAL LOW (ref 22–32)

## 2020-05-21 LAB — RAPID URINE DRUG SCREEN, HOSP PERFORMED
Amphetamines: NOT DETECTED
Barbiturates: NOT DETECTED
Benzodiazepines: NOT DETECTED
Cocaine: NOT DETECTED
Opiates: NOT DETECTED
Tetrahydrocannabinol: NOT DETECTED

## 2020-05-21 LAB — ETHANOL: Alcohol, Ethyl (B): 110 mg/dL — ABNORMAL HIGH (ref <10)

## 2020-05-21 LAB — LACTIC ACID, PLASMA: Lactic Acid, Venous: 3.8 mmol/L (ref 0.5–1.9)

## 2020-05-21 LAB — RESP PANEL BY RT-PCR (FLU A&B, COVID) ARPGX2
Influenza A by PCR: NEGATIVE
Influenza B by PCR: NEGATIVE
SARS Coronavirus 2 by RT PCR: NEGATIVE

## 2020-05-21 LAB — PROTIME-INR
INR: 1 (ref 0.8–1.2)
Prothrombin Time: 12.6 seconds (ref 11.4–15.2)

## 2020-05-21 LAB — SAMPLE TO BLOOD BANK

## 2020-05-21 MED ORDER — FLUORESCEIN SODIUM 1 MG OP STRP
1.0000 | ORAL_STRIP | Freq: Once | OPHTHALMIC | Status: AC
  Administered 2020-05-21: 1 via OPHTHALMIC
  Filled 2020-05-21: qty 1

## 2020-05-21 MED ORDER — SODIUM CHLORIDE 0.9 % IV BOLUS
1000.0000 mL | Freq: Once | INTRAVENOUS | Status: AC
  Administered 2020-05-21: 1000 mL via INTRAVENOUS

## 2020-05-21 MED ORDER — INSULIN ASPART 100 UNIT/ML ~~LOC~~ SOLN
5.0000 [IU] | Freq: Once | SUBCUTANEOUS | Status: AC
  Administered 2020-05-21: 5 [IU] via SUBCUTANEOUS

## 2020-05-21 MED ORDER — HYDROMORPHONE HCL 1 MG/ML IJ SOLN
1.0000 mg | Freq: Once | INTRAMUSCULAR | Status: AC
  Administered 2020-05-21: 1 mg via INTRAVENOUS
  Filled 2020-05-21: qty 1

## 2020-05-21 MED ORDER — TETANUS-DIPHTH-ACELL PERTUSSIS 5-2.5-18.5 LF-MCG/0.5 IM SUSY
0.5000 mL | PREFILLED_SYRINGE | Freq: Once | INTRAMUSCULAR | Status: AC
  Administered 2020-05-21: 0.5 mL via INTRAMUSCULAR
  Filled 2020-05-21: qty 0.5

## 2020-05-21 MED ORDER — SODIUM CHLORIDE 0.9 % IV SOLN: INTRAVENOUS | Status: DC

## 2020-05-21 MED ORDER — LIDOCAINE HCL 2 % IJ SOLN
20.0000 mL | Freq: Once | INTRAMUSCULAR | Status: AC
  Administered 2020-05-21: 400 mg via INTRADERMAL
  Filled 2020-05-21: qty 20

## 2020-05-21 MED ORDER — IOHEXOL 300 MG/ML  SOLN
100.0000 mL | Freq: Once | INTRAMUSCULAR | Status: AC | PRN
  Administered 2020-05-21: 100 mL via INTRAVENOUS

## 2020-05-21 MED ORDER — TETRACAINE HCL 0.5 % OP SOLN
2.0000 [drp] | Freq: Once | OPHTHALMIC | Status: AC
  Administered 2020-05-21: 2 [drp] via OPHTHALMIC
  Filled 2020-05-21: qty 4

## 2020-05-21 MED ORDER — CEFAZOLIN SODIUM-DEXTROSE 2-4 GM/100ML-% IV SOLN
2.0000 g | INTRAVENOUS | Status: AC
  Administered 2020-05-21: 2 g via INTRAVENOUS
  Filled 2020-05-21: qty 100

## 2020-05-21 MED ORDER — ONDANSETRON HCL 4 MG/2ML IJ SOLN
4.0000 mg | Freq: Once | INTRAMUSCULAR | Status: AC
  Administered 2020-05-21: 4 mg via INTRAVENOUS
  Filled 2020-05-21: qty 2

## 2020-05-21 NOTE — ED Notes (Signed)
Patient to imaging.
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
## 2020-05-21 NOTE — ED Notes (Signed)
Patient used urinal independently, declined nurse assistance. Urine noted to be light yellow and clear.
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
## 2020-05-21 NOTE — ED Notes (Signed)
Lactic acid 3.8, Laveda Norman, PA made aware of same.

## 2020-05-21 NOTE — ED Notes (Signed)
Patient to CT.

## 2020-05-22 LAB — CBG MONITORING, ED: Glucose-Capillary: 283 mg/dL — ABNORMAL HIGH (ref 70–99)

## 2020-05-22 LAB — LACTIC ACID, PLASMA: Lactic Acid, Venous: 3.2 mmol/L (ref 0.5–1.9)

## 2020-05-22 MED ORDER — SODIUM CHLORIDE 0.9 % IV BOLUS
1000.0000 mL | Freq: Once | INTRAVENOUS | Status: AC
  Administered 2020-05-22: 1000 mL via INTRAVENOUS

## 2020-05-22 MED ORDER — OXYCODONE-ACETAMINOPHEN 5-325 MG PO TABS
2.0000 | ORAL_TABLET | Freq: Once | ORAL | Status: AC
  Administered 2020-05-22: 2 via ORAL
  Filled 2020-05-22: qty 2

## 2020-05-22 MED ORDER — KETOROLAC TROMETHAMINE 15 MG/ML IJ SOLN
15.0000 mg | Freq: Once | INTRAMUSCULAR | Status: AC
  Administered 2020-05-22: 15 mg via INTRAVENOUS
  Filled 2020-05-22: qty 1

## 2020-05-22 NOTE — ED Notes (Signed)
Patient ambulated to and from restroom with steady gait
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
## 2020-05-22 NOTE — Discharge Instructions (Addendum)
Take tylenol or ibuprofen for pain. Avoid soaking your wound in stagnant or dirty water such as while taking a bath. You can shower normally. Keep the area clean with mild soap and warm water. Do not apply peroxide or alcohol to your wound as this can break down newly forming skin and prolong wound healing. If you keep the area bandaged, change the dressing/bandage at least once per day. Have your sutures removed in 7-10 days if they have not fully dissolved.  Tome tylenol o ibuprofeno para Chief Technology Officer. Evite remojar la herida en agua estancada o sucia, como cuando se baa. Puedes ducharte normalmente. Mantenga el rea limpia con Caro Hight y agua tibia. No aplique perxido ni alcohol en la herida, ya que esto puede descomponer la piel recin formada y Economist la cicatrizacin de la herida. Si mantiene el rea vendada, cambie el apsito/vendaje al menos una vez al da. Haga que le quiten las suturas en 7 a 10 das si no se han disuelto por completo.

## 2020-05-22 NOTE — ED Notes (Signed)
C-collar removed per VO Humes, PA.

## 2020-05-24 LAB — CDS SEROLOGY

## 2020-05-30 ENCOUNTER — Encounter (HOSPITAL_COMMUNITY): Payer: Self-pay

## 2020-05-30 ENCOUNTER — Ambulatory Visit (HOSPITAL_COMMUNITY)
Admission: EM | Admit: 2020-05-30 | Discharge: 2020-05-30 | Disposition: A | Discharge status: Home / Self Care | Payer: Self-pay | Attending: Urgent Care | Admitting: Urgent Care

## 2020-05-30 ENCOUNTER — Other Ambulatory Visit: Payer: Self-pay

## 2020-05-30 DIAGNOSIS — S301XXA Contusion of abdominal wall, initial encounter: Secondary | ICD-10-CM

## 2020-05-30 DIAGNOSIS — Z4802 Encounter for removal of sutures: Secondary | ICD-10-CM

## 2020-05-30 DIAGNOSIS — R109 Unspecified abdominal pain: Secondary | ICD-10-CM

## 2020-05-30 MED ORDER — TIZANIDINE HCL 4 MG PO TABS: 4.0000 mg | ORAL_TABLET | Freq: Three times a day (TID) | ORAL | 0 refills | Status: AC | PRN

## 2020-05-30 MED ORDER — METFORMIN HCL 500 MG PO TABS: 500.0000 mg | ORAL_TABLET | Freq: Two times a day (BID) | ORAL | 0 refills | Status: AC
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
## 2021-05-19 ENCOUNTER — Ambulatory Visit (HOSPITAL_COMMUNITY)
Admission: EM | Admit: 2021-05-19 | Discharge: 2021-05-19 | Disposition: A | Discharge status: Home / Self Care | Payer: Self-pay | Attending: Family Medicine | Admitting: Family Medicine

## 2021-05-19 ENCOUNTER — Other Ambulatory Visit: Payer: Self-pay

## 2021-05-19 ENCOUNTER — Encounter (HOSPITAL_COMMUNITY): Payer: Self-pay

## 2021-05-19 DIAGNOSIS — R739 Hyperglycemia, unspecified: Secondary | ICD-10-CM | POA: Insufficient documentation

## 2021-05-19 DIAGNOSIS — N481 Balanitis: Secondary | ICD-10-CM | POA: Insufficient documentation

## 2021-05-19 MED ORDER — FLUCONAZOLE 150 MG PO TABS: 150.0000 mg | ORAL_TABLET | ORAL | 0 refills | Status: DC

## 2021-05-19 MED ORDER — FLUCONAZOLE 150 MG PO TABS: 150.0000 mg | ORAL_TABLET | ORAL | 0 refills | Status: AC

## 2021-05-19 NOTE — Discharge Instructions (Addendum)
Take fluconazole 150 mg 1 tablet every 3 days for 2 doses ? ?Use hydrocortisone cream over-the-counter applied twice daily to the affected area till better. ? ?If your sugar has been running high, this can contribute to the problem you are having. ? ? ? ? ?

## 2021-05-19 NOTE — ED Provider Notes (Signed)
?[facility] ? ? ? ?CSN: [id number] ?Arrival date & time: 05/19/21  1250 ? ? ?  ? ?History   ?Chief Complaint ?Chief Complaint  ?Patient presents with  ? Rash  ? ? ?HPI ?[name] is a 41 y.o. male.  ? ? ?Rash ?Here for recurrent rash and irritation of the head of his penis.  He has been diagnosed and treated for balanitis at least twice here in our urgent care.  He states that the treatment does usually help but then it comes back and a week or 2 or a month.  Treated for it here in March 2022.  First he states that is been bothering him for a month now; when I challenged this since he has been not treated here in a year, he states that yes actually has been several months.  He has a little bit of discharge under his foreskin when this is bothering him, and also it can burn a little bit.  He asks as his wife also needs treatment.  Dates that he does do daily hygiene and cleaning under his foreskin. ? ?He has had elevated sugar in the past in epic when he has been seen in the ER for other issues. ? ?History reviewed. No pertinent past medical history. ? ?Patient Active Problem List  ? Diagnosis Date Noted  ? 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 Brief psychotic disorder (HCC)   ? ? ?Past Surgical History:  ?Procedure Laterality Date  ? NO PAST SURGERIES    ? ORIF ULNAR FRACTURE Left 01/28/2013  ? Procedure:  LEFT ULNAR OPEN REDUCTION INTERNAL FIXATION (ORIF) ;  Surgeon: Sharma Covert, MD;  Location: [facility] OR;  Service: Orthopedics;  Laterality: Left;  ? ? ? ? ? ?Home Medications   ? ?Prior to Admission medications   ?Medication Sig Start Date End Date Taking? Authorizing Provider  ?fluconazole (DIFLUCAN) 150 MG tablet Take 1 tablet (150 mg total) by mouth every 3 (three) days for 2 doses. 05/19/21 05/23/21  Zenia Resides, MD  ?metFORMIN (GLUCOPHAGE) 500 MG tablet Take 1 tablet (500 mg total) by mouth 2 (two) times daily with a meal. 05/30/20   Wallis Bamberg, PA-C  ?tiZANidine (ZANAFLEX) 4 MG tablet Take 1 tablet (4 mg total) by mouth every 8 (eight) hours as needed. 05/30/20   Wallis Bamberg, PA-C  ? ? ?Family History ?History reviewed. No pertinent family history. ? ?Social History ?Social History  ? ?Tobacco Use  ? Smoking status: Every Day  ? Smokeless tobacco: Never  ?Vaping Use  ? Vaping Use: Never used  ?Substance Use Topics  ? Alcohol use: Yes  ?  Comment: rare  ? Drug use: Yes  ?  Types: Methamphetamines  ?  Comment: unknown substance   ? ? ? ?Allergies   ?Patient has no known allergies. ? ? ?Review of Systems ?Review of Systems  ?Skin:  Positive for rash.  ? ? ?Physical Exam ?Triage Vital Signs ?ED Triage Vitals  ?Enc Vitals Group  ?   BP 05/19/21 1350 127/82  ?   Pulse Rate 05/19/21 1350 89  ?   Resp 05/19/21 1350 16  ?   Temp 05/19/21 1350 98.3 ?F (36.8 ?C)  ?   Temp Source 05/19/21 1350 Oral  ?   SpO2 05/19/21 1350 96 %  ?   Weight --   ?  Height --   ?   Head Circumference --   ?   Peak Flow --   ?   Pain Score 05/19/21 1349 0  ?   Pain Loc --   ?   Pain Edu? --   ?   Excl. in GC? --   ? ?No data found. ? ?Updated Vital Signs ?BP 127/82 (BP Location: Left Arm)   Pulse 89   Temp 98.3 ?F (36.8 ?C) (Oral)   Resp 16   SpO2 96%  ? ?Visual Acuity ?Right Eye Distance:   ?Left Eye Distance:   ?Bilateral Distance:   ? ?Right Eye Near:   ?Left Eye Near:    ?Bilateral Near:    ? ?Physical Exam ?Constitutional:   ?   General: He is not in acute distress. ?   Appearance: He is not toxic-appearing.  ?Genitourinary: ?   Comments: No edema of the penis. Mild erythema of the foreskin. Some white cheesy dc on the penis. ?Neurological:  ?   Mental Status: He is alert and oriented to person, place, and time.  ?Psychiatric:     ?   Behavior: Behavior normal.  ? ? ? ?UC Treatments / Results  ?Labs ?(all labs ordered are listed, but only  abnormal results are displayed) ?Labs Reviewed  ?CYTOLOGY, (ORAL, ANAL, URETHRAL) ANCILLARY ONLY  ? ? ?EKG ? ? ?Radiology ?No results found. ? ?Procedures ?Procedures (including critical care time) ? ?Medications Ordered in UC ?Medications - No data to display ? ?Initial Impression / Assessment and Plan / UC Course  ?I have reviewed the triage vital signs and the nursing notes. ? ?Pertinent labs & imaging results that were available during my care of the patient were reviewed by me and considered in my medical decision making (see chart for details). ? ?  ? ?Request made to help him find a primary doctor.  So that they can address possible hyperglycemia and diabetes.  Education given on hygiene to hopefully prevent this problem.  Also fluconazole and hydrocortisone cream to treat the balanitis in the short-term. ? ?Discussed possibly having a circumcision to make this improve.  But I think he needs to address some other issues first ?Final Clinical Impressions(s) / UC Diagnoses  ? ?Final diagnoses:  ?Balanitis  ?Hyperglycemia  ? ? ? ?Discharge Instructions   ? ?  ?Take fluconazole 150 mg 1 tablet every 3 days for 2 doses ? ?Use hydrocortisone cream over-the-counter applied twice daily to the affected area till better. ? ?If your sugar has been running high, this can contribute to the problem you are having. ? ? ? ? ? ? ? ? ?ED Prescriptions   ? ? Medication Sig Dispense Auth. Provider  ? fluconazole (DIFLUCAN) 150 MG tablet Take 1 tablet (150 mg total) by mouth every 3 (three) days for 2 doses. 2 tablet Zenia Resides, MD  ? ?  ? ?PDMP not reviewed this encounter. ?  ?Zenia Resides, MD ?05/19/21 1451 ? ?

## 2021-05-19 NOTE — ED Triage Notes (Signed)
Patient presents to Urgent Care for STD testing. Pt c/o of penile rash and has noted pimple like bumps on his penis. He is concerned that he may have re-infected himself when having intercourse with his wife. As he reports he has been treated on many occasions but unsure if his wife req treatment as well. He states this rash has been an on-going problem since 10/2019.  ?

## 2021-05-22 LAB — CYTOLOGY, (ORAL, ANAL, URETHRAL) ANCILLARY ONLY
Chlamydia: NEGATIVE
Comment: NEGATIVE
Comment: NEGATIVE
Comment: NORMAL
Neisseria Gonorrhea: NEGATIVE
Trichomonas: NEGATIVE

## 2021-06-05 ENCOUNTER — Encounter (HOSPITAL_COMMUNITY): Payer: Self-pay

## 2021-06-05 ENCOUNTER — Emergency Department (HOSPITAL_COMMUNITY)
Admission: EM | Admit: 2021-06-05 | Discharge: 2021-06-06 | Disposition: A | Discharge status: Home / Self Care | Payer: No Typology Code available for payment source | Attending: Emergency Medicine | Admitting: Emergency Medicine

## 2021-06-05 ENCOUNTER — Emergency Department (HOSPITAL_COMMUNITY): Payer: No Typology Code available for payment source

## 2021-06-05 ENCOUNTER — Other Ambulatory Visit: Payer: Self-pay

## 2021-06-05 DIAGNOSIS — Z5321 Procedure and treatment not carried out due to patient leaving prior to being seen by health care provider: Secondary | ICD-10-CM | POA: Insufficient documentation

## 2021-06-05 DIAGNOSIS — Y92410 Unspecified street and highway as the place of occurrence of the external cause: Secondary | ICD-10-CM | POA: Insufficient documentation

## 2021-06-05 DIAGNOSIS — M549 Dorsalgia, unspecified: Secondary | ICD-10-CM | POA: Insufficient documentation

## 2021-06-05 MED ORDER — OXYCODONE-ACETAMINOPHEN 5-325 MG PO TABS
1.0000 | ORAL_TABLET | Freq: Once | ORAL | Status: AC
  Administered 2021-06-05: 1 via ORAL
  Filled 2021-06-05: qty 1
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
## 2021-06-06 NOTE — ED Notes (Signed)
Called for pt to room X3. No answer ?
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
